# Patient Record
Sex: Female | Born: 1985
Health system: Southern US, Community
[De-identification: ages and names within clinical notes are randomized; demographics above are authoritative.]

## PROBLEM LIST (undated history)

## (undated) DIAGNOSIS — K219 Gastro-esophageal reflux disease without esophagitis: Secondary | ICD-10-CM

## (undated) DIAGNOSIS — B009 Herpesviral infection, unspecified: Secondary | ICD-10-CM

## (undated) DIAGNOSIS — J302 Other seasonal allergic rhinitis: Secondary | ICD-10-CM

## (undated) DIAGNOSIS — G43909 Migraine, unspecified, not intractable, without status migrainosus: Secondary | ICD-10-CM

## (undated) DIAGNOSIS — R51 Headache: Secondary | ICD-10-CM

## (undated) DIAGNOSIS — E282 Polycystic ovarian syndrome: Secondary | ICD-10-CM

## (undated) DIAGNOSIS — F32A Depression, unspecified: Secondary | ICD-10-CM

## (undated) DIAGNOSIS — R519 Headache, unspecified: Secondary | ICD-10-CM

## (undated) HISTORY — PX: CHOLECYSTECTOMY: SHX55

## (undated) HISTORY — DX: Gastro-esophageal reflux disease without esophagitis: K21.9

## (undated) HISTORY — DX: Depression, unspecified: F32.A

## (undated) HISTORY — DX: Polycystic ovarian syndrome: E28.2

## (undated) HISTORY — PX: TUBAL LIGATION: SHX77

## (undated) HISTORY — DX: Headache: R51

## (undated) HISTORY — DX: Migraine, unspecified, not intractable, without status migrainosus: G43.909

## (undated) HISTORY — DX: Herpesviral infection, unspecified: B00.9

## (undated) HISTORY — DX: Headache, unspecified: R51.9

## (undated) HISTORY — DX: Other seasonal allergic rhinitis: J30.2

---
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
## 2006-01-01 ENCOUNTER — Emergency Department: Payer: Self-pay | Admitting: Emergency Medicine

## 2006-06-19 ENCOUNTER — Emergency Department: Payer: Self-pay | Admitting: Emergency Medicine

## 2006-06-29 ENCOUNTER — Emergency Department: Payer: Self-pay | Admitting: Unknown Physician Specialty

## 2006-08-18 ENCOUNTER — Encounter: Payer: Self-pay | Admitting: Maternal & Fetal Medicine

## 2006-12-18 ENCOUNTER — Observation Stay: Payer: Self-pay | Admitting: Obstetrics and Gynecology

## 2006-12-20 ENCOUNTER — Observation Stay: Payer: Self-pay | Admitting: Obstetrics and Gynecology

## 2007-01-20 ENCOUNTER — Inpatient Hospital Stay: Payer: Self-pay | Admitting: Obstetrics and Gynecology

## 2007-10-28 ENCOUNTER — Emergency Department: Payer: Self-pay | Admitting: Emergency Medicine

## 2008-03-14 ENCOUNTER — Emergency Department: Payer: Self-pay | Admitting: Emergency Medicine

## 2008-03-29 ENCOUNTER — Emergency Department: Payer: Self-pay | Admitting: Emergency Medicine

## 2008-05-16 ENCOUNTER — Emergency Department: Payer: Self-pay | Admitting: Internal Medicine

## 2008-09-12 ENCOUNTER — Emergency Department: Payer: Self-pay | Admitting: Unknown Physician Specialty

## 2009-10-18 ENCOUNTER — Ambulatory Visit: Payer: Self-pay | Admitting: General Practice

## 2010-04-08 ENCOUNTER — Emergency Department: Payer: Self-pay | Admitting: Emergency Medicine

## 2013-01-01 ENCOUNTER — Emergency Department: Payer: Self-pay | Admitting: Emergency Medicine

## 2013-01-07 ENCOUNTER — Emergency Department: Payer: Self-pay | Admitting: Emergency Medicine

## 2013-05-18 ENCOUNTER — Emergency Department: Payer: Self-pay | Admitting: Emergency Medicine

## 2013-05-18 LAB — COMPREHENSIVE METABOLIC PANEL
ALT: 58 U/L (ref 12–78)
AST: 26 U/L (ref 15–37)
Albumin: 4.2 g/dL (ref 3.4–5.0)
Alkaline Phosphatase: 67 U/L
Anion Gap: 7 (ref 7–16)
BILIRUBIN TOTAL: 0.5 mg/dL (ref 0.2–1.0)
BUN: 10 mg/dL (ref 7–18)
Calcium, Total: 9.7 mg/dL (ref 8.5–10.1)
Chloride: 103 mmol/L (ref 98–107)
Co2: 28 mmol/L (ref 21–32)
Creatinine: 0.72 mg/dL (ref 0.60–1.30)
EGFR (Non-African Amer.): >60
GLUCOSE: 118 mg/dL — AB (ref 65–99)
OSMOLALITY: 276 (ref 275–301)
POTASSIUM: 3.2 mmol/L — AB (ref 3.5–5.1)
Sodium: 138 mmol/L (ref 136–145)
TOTAL PROTEIN: 8 g/dL (ref 6.4–8.2)

## 2013-05-18 LAB — CBC WITH DIFFERENTIAL/PLATELET
BASOS ABS: 0 10*3/uL (ref 0.0–0.1)
Basophil %: 0.2 %
Eosinophil #: 0 10*3/uL (ref 0.0–0.7)
Eosinophil %: 0 %
HCT: 44.9 % (ref 35.0–47.0)
HGB: 15.8 g/dL (ref 12.0–16.0)
LYMPHS PCT: 26 %
Lymphocyte #: 2.4 10*3/uL (ref 1.0–3.6)
MCH: 30.5 pg (ref 26.0–34.0)
MCHC: 35.3 g/dL (ref 32.0–36.0)
MCV: 87 fL (ref 80–100)
MONOS PCT: 10.1 %
Monocyte #: 0.9 x10 3/mm (ref 0.2–0.9)
Neutrophil #: 5.9 10*3/uL (ref 1.4–6.5)
Neutrophil %: 63.7 %
Platelet: 225 10*3/uL (ref 150–440)
RBC: 5.19 10*6/uL (ref 3.80–5.20)
RDW: 12.7 % (ref 11.5–14.5)
WBC: 9.3 10*3/uL (ref 3.6–11.0)

## 2013-05-18 LAB — URINALYSIS, COMPLETE
BLOOD: NEGATIVE
Bacteria: NONE SEEN
Bilirubin,UR: NEGATIVE
Glucose,UR: NEGATIVE mg/dL (ref 0–75)
Ketone: NEGATIVE
LEUKOCYTE ESTERASE: NEGATIVE
Nitrite: NEGATIVE
PH: 6 (ref 4.5–8.0)
PROTEIN: NEGATIVE
RBC,UR: 3 /HPF (ref 0–5)
SPECIFIC GRAVITY: 1.025 (ref 1.003–1.030)
Squamous Epithelial: 3

## 2013-05-18 LAB — LIPASE, BLOOD: Lipase: 178 U/L (ref 73–393)

## 2013-06-07 ENCOUNTER — Ambulatory Visit: Payer: Self-pay | Admitting: Anesthesiology

## 2013-06-07 LAB — HEPATIC FUNCTION PANEL A (ARMC)
AST: 16 U/L (ref 15–37)
Albumin: 3.6 g/dL (ref 3.4–5.0)
Alkaline Phosphatase: 63 U/L
BILIRUBIN TOTAL: 0.6 mg/dL (ref 0.2–1.0)
SGPT (ALT): 39 U/L (ref 12–78)
Total Protein: 7.3 g/dL (ref 6.4–8.2)

## 2013-06-07 LAB — POTASSIUM: POTASSIUM: 3.4 mmol/L — AB (ref 3.5–5.1)

## 2013-06-09 ENCOUNTER — Ambulatory Visit: Payer: Self-pay | Admitting: Surgery

## 2013-06-10 LAB — PATHOLOGY REPORT

## 2014-03-05 ENCOUNTER — Emergency Department: Payer: Self-pay | Admitting: Student

## 2014-03-05 LAB — CBC WITH DIFFERENTIAL/PLATELET
Basophil #: 0 10*3/uL (ref 0.0–0.1)
Basophil %: 0.3 %
Eosinophil #: 0 10*3/uL (ref 0.0–0.7)
Eosinophil %: 0 %
HCT: 47.3 % — ABNORMAL HIGH (ref 35.0–47.0)
HGB: 16 g/dL (ref 12.0–16.0)
LYMPHS PCT: 20.1 %
Lymphocyte #: 2.6 10*3/uL (ref 1.0–3.6)
MCH: 29.8 pg (ref 26.0–34.0)
MCHC: 33.8 g/dL (ref 32.0–36.0)
MCV: 88 fL (ref 80–100)
MONOS PCT: 6.2 %
Monocyte #: 0.8 x10 3/mm (ref 0.2–0.9)
NEUTROS PCT: 73.4 %
Neutrophil #: 9.5 10*3/uL — ABNORMAL HIGH (ref 1.4–6.5)
Platelet: 280 10*3/uL (ref 150–440)
RBC: 5.35 10*6/uL — AB (ref 3.80–5.20)
RDW: 12.7 % (ref 11.5–14.5)
WBC: 12.9 10*3/uL — AB (ref 3.6–11.0)

## 2014-03-05 LAB — BASIC METABOLIC PANEL
Anion Gap: 9 (ref 7–16)
BUN: 10 mg/dL (ref 7–18)
CHLORIDE: 108 mmol/L — AB (ref 98–107)
CO2: 20 mmol/L — AB (ref 21–32)
Calcium, Total: 9.2 mg/dL (ref 8.5–10.1)
Creatinine: 0.67 mg/dL (ref 0.60–1.30)
EGFR (Non-African Amer.): >60
Glucose: 117 mg/dL — ABNORMAL HIGH (ref 65–99)
Osmolality: 274 (ref 275–301)
POTASSIUM: 3.4 mmol/L — AB (ref 3.5–5.1)
Sodium: 137 mmol/L (ref 136–145)

## 2014-03-05 LAB — URINALYSIS, COMPLETE
Bacteria: NONE SEEN
Bilirubin,UR: NEGATIVE
Blood: NEGATIVE
GLUCOSE, UR: NEGATIVE mg/dL (ref 0–75)
Ketone: NEGATIVE
Leukocyte Esterase: NEGATIVE
Nitrite: NEGATIVE
PROTEIN: NEGATIVE
Ph: 5 (ref 4.5–8.0)
RBC,UR: 3 /HPF (ref 0–5)
Specific Gravity: 1.029 (ref 1.003–1.030)
Squamous Epithelial: 5
WBC UR: 2 /HPF (ref 0–5)

## 2014-07-30 NOTE — Op Note (Signed)
PATIENT NAME:  April HalimLAWLESS, [name] MR#:  [id number] DATE OF BIRTH:  06-Sep-1985  DATE OF PROCEDURE:  06/09/2013  PREOPERATIVE DIAGNOSIS: Symptomatic cholelithiasis.   POSTOPERATIVE DIAGNOSIS: Symptomatic cholelithiasis.   PROCEDURE PERFORMED: Laparoscopic cholecystectomy.   SURGEON: Dionne Miloichard Cooper, M.D.   ANESTHESIA: General with endotracheal.   ASSISTANT:  Alfredo MartinezJustin Ollis, PA-S.   INDICATIONS: This is a patient with recurrent epigastric and right upper quadrant pain associated with fatty food intolerance and workup showing gallstones. Preoperatively, we discussed the rationale for surgery, the options of observation, risk of bleeding, infection, recurrence of symptoms, failure to resolve her symptoms, open procedure, bile duct damage, bile duct leak, retained common bile duct stone, any of which could require further surgery and/or ERCP, stent and papillotomy. This was all reviewed for her in the preop holding area. She understood and agreed to proceed.   FINDINGS: Fairly normal-appearing gallbladder with some scar in the infundibulum of the gallbladder with stones,   DESCRIPTION OF PROCEDURE: The patient was induced with general anesthesia, given IV antibiotics. VTE prophylaxis was in place. She was prepped and draped in a sterile fashion. Marcaine was infiltrated in the skin and subcutaneous tissues around the supraumbilical area, avoiding her piercing site. A Veress needle was placed. Pneumoperitoneum was obtained and a 5 mm trocar port was placed. The abdominal cavity was explored and under direct vision a 10 mm epigastric port and 2 lateral 5 mm ports were placed. The gallbladder was placed on tension. Adhesions were taken down bluntly and sharply without the use of energy. The peritoneum over the infundibulum was incised bluntly. The cystic duct and gallbladder junction was well identified. The cystic lymphatics were doubly clipped and divided, as was the cystic artery. This allowed for good  visualization of the cystic duct as it entered the infundibulum of the gallbladder. Here, it was doubly clipped and divided and the gallbladder was taken from the gallbladder fossa with electrocautery and passed out through the epigastric port site with the aid of an EndoCatch bag. The area was checked for hemostasis and found to be adequate. There was no of bleeding, bile leak or bowel injury. The camera was placed in the epigastric site to view back at the periumbilical site. No sign of adhesions or hernia. Therefore, after irrigating and aspirating and rechecking hemostasis and finding it to be adequate, the camera was removed. Pneumoperitoneum was released. All ports were removed and then figure-of-eight 0 Vicryl sutures were placed at the epigastric fascia, then 4-0 subcuticular Monocryl was used on all skin edges. Steri-Strips, Mastisol and sterile dressings were placed.   The patient tolerated the procedure well. There were no complications. She was taken to the recovery room in stable condition to be discharged in the care of her family to follow up in 10 days.     ____________________________ Adah Salvageichard E. Excell Seltzerooper, MD rec:dmm D: 06/09/2013 13:10:00 ET T: 06/09/2013 19:23:20 ET JOB#: [id number]  cc: Adah Salvageichard E. Excell Seltzerooper, MD, <Dictator> Lattie HawICHARD E COOPER MD ELECTRONICALLY SIGNED 06/11/2013 19:11

## 2014-07-30 NOTE — H&P (Signed)
PATIENT NAME:  April Buck, April R MR#:  [id number] DATE OF BIRTH:  September 07, 1985  DATE OF ADMISSION:  06/09/2013  CHIEF COMPLAINT: Right upper quadrant pain.   HISTORY OF PRESENT ILLNESS: This is a patient with recurrent epigastric and right upper quadrant pain associated with fatty food intolerance. A workup has shown gallstones, and she was seen in the office, where the decision to perform laparoscopic cholecystectomy for control of her symptoms was made. She has been counseled and is here for elective surgery.   PAST MEDICAL HISTORY: C-section x2, tubal ligation and LEEP surgery.   ALLERGIES: LATEX.   MEDICATIONS: None.   FAMILY HISTORY: Noncontributory.   SOCIAL HISTORY: The patient does not smoke and rarely drinks alcohol.   REVIEW OF SYSTEMS: A 10-system review has been performed and is documented in the office chart and negative other than that mentioned in the HPI.   PHYSICAL EXAMINATION:  GENERAL: Healthy female patient.  HEENT: Shows no scleral icterus.  NECK: No palpable neck nodes.  CHEST: Clear to auscultation.  CARDIAC: Regular rate and rhythm.  ABDOMEN: Soft, nontender.  EXTREMITIES: Without edema.  NEUROLOGIC: Grossly intact.  INTEGUMENT: Shows no jaundice.   LABORATORY VALUES: Demonstrated an H and H of 15.8 and 44.9 and a platelet count of 225. AST and ALT are within normal limits. An ultrasound shows gallstones.   ASSESSMENT AND PLAN: This is a patient with symptomatic cholelithiasis. She is here for elective laparoscopic cholecystectomy. The rationale for offering surgery has been discussed. The options of observation and the risks of bleeding, infection, recurrence of symptoms, conversion to an open procedure, bile duct damage, bile duct leak, retained common bile duct stone, bowel injury, any of which could require further surgery and/or ERCP, stent and papillotomy, have all been discussed with her. She understood and agreed to proceed.    ____________________________ Adah Salvageichard E. Excell Seltzerooper, MD rec:lb D: 06/09/2013 08:32:46 ET T: 06/09/2013 08:57:22 ET JOB#: [id number]  cc: Adah Salvageichard E. Excell Seltzerooper, MD, <Dictator> Lattie HawICHARD E Tranice Laduke MD ELECTRONICALLY SIGNED 06/11/2013 19:11

## 2014-12-29 ENCOUNTER — Emergency Department: Payer: Self-pay

## 2014-12-29 ENCOUNTER — Emergency Department
Admission: EM | Admit: 2014-12-29 | Discharge: 2014-12-29 | Disposition: A | Discharge status: Home / Self Care | Payer: Self-pay | Attending: Emergency Medicine | Admitting: Emergency Medicine

## 2014-12-29 ENCOUNTER — Encounter: Payer: Self-pay | Admitting: Emergency Medicine

## 2014-12-29 DIAGNOSIS — Y998 Other external cause status: Secondary | ICD-10-CM | POA: Insufficient documentation

## 2014-12-29 DIAGNOSIS — Y9289 Other specified places as the place of occurrence of the external cause: Secondary | ICD-10-CM | POA: Insufficient documentation

## 2014-12-29 DIAGNOSIS — Y9389 Activity, other specified: Secondary | ICD-10-CM | POA: Insufficient documentation

## 2014-12-29 DIAGNOSIS — X58XXXA Exposure to other specified factors, initial encounter: Secondary | ICD-10-CM | POA: Insufficient documentation

## 2014-12-29 DIAGNOSIS — S93401A Sprain of unspecified ligament of right ankle, initial encounter: Secondary | ICD-10-CM | POA: Insufficient documentation

## 2014-12-29 MED ORDER — NAPROXEN 500 MG PO TBEC: 500.0000 mg | DELAYED_RELEASE_TABLET | Freq: Two times a day (BID) | ORAL | Status: DC

## 2014-12-29 NOTE — ED Provider Notes (Signed)
[facility] Emergency Department Provider Note ____________________________________________  Time seen: 1250  I have reviewed the triage vital signs and the nursing notes.  HISTORY  Chief Complaint  Ankle Pain  HPI April Buck is a 29 y.o. female reports to the ED for evaluation of continued intermittent swelling to the right ankle after original injury 2 months prior. He reports she was playing with the children at the playground, and ran up the slide when she accidentally injured her ankle. She noted subsequent swelling, bruising, pain, and disability following the injury. She dosed ibuprofen, and intermittently apply ice. She did note that there were times when she had very little pain to the ankle and at the time she had recurrent swelling to the ankle. She denies any injury or reinjury in the interim she presents today for evaluation of lateral right ankle pain and swelling.  History reviewed. No pertinent past medical history.  There are no active problems to display for this patient.  History reviewed. No pertinent past surgical history.  Current Outpatient Rx  Name  Route  Sig  Dispense  Refill  . naproxen (EC NAPROSYN) 500 MG EC tablet   Oral   Take 1 tablet (500 mg total) by mouth 2 (two) times daily with a meal.   30 tablet   0     Allergies Review of patient's allergies indicates no known allergies.  No family history on file.  Social History Social History  Substance Use Topics  . Smoking status: Never Smoker   . Smokeless tobacco: None  . Alcohol Use: No   Review of Systems  Constitutional: Negative for fever. Eyes: Negative for visual changes. ENT: Negative for sore throat. Cardiovascular: Negative for chest pain. Respiratory: Negative for shortness of breath. Gastrointestinal: Negative for abdominal pain, vomiting and diarrhea. Genitourinary: Negative for dysuria. Musculoskeletal: Negative for back pain. Reports  intermittent right ankle pain and swelling as above Skin: Negative for rash. Neurological: Negative for headaches, focal weakness or numbness. ____________________________________________  PHYSICAL EXAM:  VITAL SIGNS: ED Triage Vitals  Enc Vitals Group     BP 12/29/14 1132 143/70 mmHg     Pulse Rate 12/29/14 1132 90     Resp 12/29/14 1132 20     Temp 12/29/14 1132 98 F (36.7 C)     Temp Source 12/29/14 1132 Oral     SpO2 12/29/14 1132 100 %     Weight 12/29/14 1132 218 lb (98.884 kg)     Height 12/29/14 1132  (1.676 m)     Head Cir --      Peak Flow --      Pain Score --      Pain Loc --      Pain Edu? --      Excl. in GC? --    Constitutional: Alert and oriented. Well appearing and in no distress. Eyes: Conjunctivae are normal. PERRL. Normal extraocular movements. ENT   Head: Normocephalic and atraumatic.   Nose: No congestion/rhinorrhea.   Mouth/Throat: Mucous membranes are moist.   Neck: Supple. No thyromegaly. Hematological/Lymphatic/Immunological: No cervical lymphadenopathy. Cardiovascular: Normal rate, regular rhythm. Normal distal pulses. Respiratory: Normal respiratory effort. No wheezes/rales/rhonchi. Gastrointestinal: Soft and nontender. No distention. Musculoskeletal: Right ankle without obvious deformity, swelling, or ecchymosis. She is minimally tender to palp over the lateral ankle at the ATF ligament. She noted to have a negative drawer, and or Achilles tenderness. Nontender with normal range of motion in all extremities.  Neurologic:  Normal gait  without ataxia. Normal speech and language. No gross focal neurologic deficits are appreciated. Skin:  Skin is warm, dry and intact. No rash noted. Psychiatric: Mood and affect are normal. Patient exhibits appropriate insight and judgment. ____________________________________________   RADIOLOGY Right Ankle IMPRESSION: No fracture or dislocation.  I, Menshew, Charlesetta Ivory, personally viewed  and evaluated these images (plain radiographs) as part of my medical decision making.  ____________________________________________  PROCEDURES  AnkleStirrup Ace bandage ____________________________________________  INITIAL IMPRESSION / ASSESSMENT AND PLAN / ED COURSE  Grade 2 ankle sprain with recurrent swelling. Patient will be fitted with a stirrup splint, and given instructions on ankle rehabilitation exercises. She will be encouraged follow-up with orthopedics for ongoing symptoms. ____________________________________________  FINAL CLINICAL IMPRESSION(S) / ED DIAGNOSES  Final diagnoses:  Ankle sprain, right, initial encounter      Lissa Hoard, PA-C 12/29/14 1343  Minna Antis, MD 12/29/14 1506

## 2014-12-29 NOTE — Discharge Instructions (Signed)
Ankle Exercises for Rehabilitation Following ankle injuries, it is as important to follow your caregiver's instructions for regaining full use of your ankle as it was to follow the initial treatment plan following the injury. The following are some suggestions for exercises and treatment, which can be done to help you regain full use of your ankle as soon as possible.  Follow all instructions regarding physical therapy.  Before exercising, it may be helpful to use heat on the muscles or joint being exercised. This loosens up the muscles and tendons (cordlike structure) and decreases chances of injury during your exercises. If this is not possible, just begin your exercises slowly to gradually warm up.  Stand on your toes several times per day to strengthen the calf muscles. These are the muscles in the back of your leg between the knee and the heel. The cord you can feel just above the heel is the Achilles tendon. Rise up on your toes several times repeating this three to four times per day. Do not exercise to the point of pain. If pain starts to develop, decrease the exercise until you are comfortable again.  Do range of motion exercises. This means moving the ankle in all directions. Practice writing the alphabet with your toes in the air. Do not increase beyond a range that is comfortable.  Increase the strength of the muscles in the front of your leg by raising your toes and foot straight up in the air. Repeat this exercise as you did the calf exercise with the same warnings. This also help to stretch your muscles.  Stretch your calf muscles also by leaning against a wall with your hands in front of you. Put your feet a few feet from the wall and bend your knees until you feel the muscles in your calves become tight.  After exercising it may be helpful to put ice on the ankle to prevent swelling and improve rehabilitation. This may be done for 15 to 20 minutes following your exercises. If  exercising is being done in the workplace, this may not always be possible.  Taping an ankle injury may be helpful to give added support following an injury. It also may help prevent reinjury. This may be true if you are in training or in a conditioning program. You and your caregiver can decide on the best course of action to follow. Document Released: 03/22/2000 Document Revised: 08/09/2013 Document Reviewed: 03/19/2008 [facility] Patient Information 2015 [place], [place]. This information is not intended to replace advice given to you by your health care provider. Make sure you discuss any questions you have with your health care provider.  Ankle Sprain An ankle sprain is an injury to the strong, fibrous tissues (ligaments) that hold your ankle bones together.  HOME CARE   Put ice on your ankle for 1-2 days or as told by your doctor.  Put ice in a plastic bag.  Place a towel between your skin and the bag.  Leave the ice on for 15-20 minutes at a time, every 2 hours while you are awake.  Only take medicine as told by your doctor.  Raise (elevate) your injured ankle above the level of your heart as much as possible for 2-3 days.  Use crutches if your doctor tells you to. Slowly put your own weight on the affected ankle. Use the crutches until you can walk without pain.  If you have a plaster splint:  Do not rest it on anything harder than a pillow  for 24 hours.  Do not put weight on it.  Do not get it wet.  Take it off to shower or bathe.  If given, use an elastic wrap or support stocking for support. Take the wrap off if your toes lose feeling (numb), tingle, or turn cold or blue.  If you have an air splint:  Add or let out air to make it comfortable.  Take it off at night and to shower and bathe.  Wiggle your toes and move your ankle up and down often while you are wearing it. GET HELP IF:  You have rapidly increasing bruising or puffiness (swelling).  Your toes feel  very cold.  You lose feeling in your foot.  Your medicine does not help your pain. GET HELP RIGHT AWAY IF:   Your toes lose feeling (numb) or turn blue.  You have severe pain that is increasing. MAKE SURE YOU:   Understand these instructions.  Will watch your condition.  Will get help right away if you are not doing well or get worse. Document Released: 09/11/2007 Document Revised: 08/09/2013 Document Reviewed: 10/07/2011 [facility] Patient Information 2015 [place], [place]. This information is not intended to replace advice given to you by your health care provider. Make sure you discuss any questions you have with your health care provider.  Keep the ankle splinted as needed for support. Take the prescription Naproxen as needed for pain and inflammation. Apply ice as needed. Follow-up with Dr. Martha Clan as needed.

## 2014-12-29 NOTE — ED Notes (Signed)
States she had an injury to right ankle 2 months ago conts to have swelling and pain

## 2015-06-08 ENCOUNTER — Encounter (INDEPENDENT_AMBULATORY_CARE_PROVIDER_SITE_OTHER): Payer: Self-pay

## 2015-06-08 ENCOUNTER — Encounter: Payer: Self-pay | Admitting: Primary Care

## 2015-06-08 ENCOUNTER — Ambulatory Visit (INDEPENDENT_AMBULATORY_CARE_PROVIDER_SITE_OTHER): Payer: BLUE CROSS/BLUE SHIELD | Admitting: Primary Care

## 2015-06-08 ENCOUNTER — Other Ambulatory Visit: Payer: Self-pay | Admitting: Primary Care

## 2015-06-08 VITALS — BP 118/72 | HR 88 | Temp 98.1°F | Ht 65.0 in | Wt 218.0 lb

## 2015-06-08 DIAGNOSIS — R002 Palpitations: Secondary | ICD-10-CM

## 2015-06-08 DIAGNOSIS — A6000 Herpesviral infection of urogenital system, unspecified: Secondary | ICD-10-CM

## 2015-06-08 DIAGNOSIS — F411 Generalized anxiety disorder: Secondary | ICD-10-CM | POA: Insufficient documentation

## 2015-06-08 DIAGNOSIS — N926 Irregular menstruation, unspecified: Secondary | ICD-10-CM

## 2015-06-08 LAB — CBC
HEMATOCRIT: 43.5 % (ref 36.0–46.0)
Hemoglobin: 14.8 g/dL (ref 12.0–15.0)
MCHC: 34.1 g/dL (ref 30.0–36.0)
MCV: 86.8 fl (ref 78.0–100.0)
PLATELETS: 267 10*3/uL (ref 150.0–400.0)
RBC: 5.01 Mil/uL (ref 3.87–5.11)
RDW: 12.7 % (ref 11.5–15.5)
WBC: 7.5 10*3/uL (ref 4.0–10.5)

## 2015-06-08 LAB — COMPREHENSIVE METABOLIC PANEL
ALBUMIN: 4.4 g/dL (ref 3.5–5.2)
ALK PHOS: 45 U/L (ref 39–117)
ALT: 38 U/L — ABNORMAL HIGH (ref 0–35)
AST: 23 U/L (ref 0–37)
BUN: 10 mg/dL (ref 6–23)
CALCIUM: 9.4 mg/dL (ref 8.4–10.5)
CHLORIDE: 105 meq/L (ref 96–112)
CO2: 27 mEq/L (ref 19–32)
CREATININE: 0.66 mg/dL (ref 0.40–1.20)
GFR: 112.44 mL/min (ref >60.00)
Glucose, Bld: 88 mg/dL (ref 70–99)
POTASSIUM: 3.5 meq/L (ref 3.5–5.1)
Sodium: 139 mEq/L (ref 135–145)
TOTAL PROTEIN: 7.3 g/dL (ref 6.0–8.3)
Total Bilirubin: 0.6 mg/dL (ref 0.2–1.2)

## 2015-06-08 LAB — TSH: TSH: 0.75 u[IU]/mL (ref 0.35–4.50)

## 2015-06-08 LAB — TESTOSTERONE: Testosterone: 50.48 ng/dL — ABNORMAL HIGH (ref 15.00–40.00)

## 2015-06-08 LAB — HEMOGLOBIN A1C: HEMOGLOBIN A1C: 5.3 % (ref 4.6–6.5)

## 2015-06-08 LAB — LIPID PANEL
CHOLESTEROL: 147 mg/dL (ref 0–200)
HDL: 45.1 mg/dL (ref >39.00)
LDL Cholesterol: 71 mg/dL (ref 0–99)
NONHDL: 101.62
TRIGLYCERIDES: 151 mg/dL — AB (ref 0.0–149.0)
Total CHOL/HDL Ratio: 3
VLDL: 30.2 mg/dL (ref 0.0–40.0)

## 2015-06-08 MED ORDER — VALACYCLOVIR HCL 500 MG PO TABS: 500.0000 mg | ORAL_TABLET | Freq: Two times a day (BID) | ORAL | Status: DC

## 2015-06-08 MED ORDER — ESCITALOPRAM OXALATE 10 MG PO TABS: 10.0000 mg | ORAL_TABLET | Freq: Every day | ORAL | Status: DC

## 2015-06-08 NOTE — Progress Notes (Signed)
Subjective:    Patient ID: April Buck, female    DOB: 1986/03/03, 30 y.o.   MRN: [id number]  HPI  April Buck is a 30 year old female who presents today to establish care and discuss the problems mentioned below. Will obtain old records. Her last physical was years ago.   1) Anxiety: No formal diagnosis. She describes symptoms of feeling easily irritable, anxious, restless, intermittent palpitations, and daily worry. Her symptoms have been present for the past 7 years. She has tried managing her symptoms with meditations, meeting with a therapist provided at work, and deep breathing exercises. She's never been managed on medication in the past. GAD 7 score of 19 today. Denies depression symptoms. Overall her calming mechanisms aren't working to calm her anxiety.  2) Palpitations: Present since last pregnancy about 4 years ago. Will occur intermittently at rest and during exertion. Periods of "heart racing" that will last seconds to minutes. She does have a GAD 7 score of 19 today. FH of heart attack from aunt and grandmother. Denies chest pain. No recent blood work and has not sought care for palpitations in the past.  3) Irregular Periods: Present for 3 years. She's had a tubal ligation. Her LMP was in mid Februray 2017, prior to that was December 2016, then October 2016. Endorses excessive hair growth to breasts, abdomen, face, neck. FH of diabetes in mother and grandparents. She's checking her sugar at home sporatically and will get readings on average of 140. Denies polyuria, polydipsia, polyphagia.  Review of Systems  Constitutional: Negative for unexpected weight change.  HENT: Negative for rhinorrhea.   Respiratory: Negative for shortness of breath.   Cardiovascular: Positive for palpitations.  Gastrointestinal: Negative for abdominal pain.  Genitourinary: Positive for menstrual problem.  Skin: Negative for rash.  Allergic/Immunologic: Negative for environmental allergies.    Neurological: Negative for headaches.  Psychiatric/Behavioral: The patient is nervous/anxious.        Past Medical History  Diagnosis Date  . Frequent headaches   . GERD (gastroesophageal reflux disease)   . Seasonal allergies   . Herpes     Social History   Social History  . Marital Status: Married    Spouse Name: N/A  . Number of Children: N/A  . Years of Education: N/A   Occupational History  . Not on file.   Social History Main Topics  . Smoking status: Never Smoker   . Smokeless tobacco: Not on file  . Alcohol Use: No  . Drug Use: No  . Sexual Activity: Not on file   Other Topics Concern  . Not on file   Social History Narrative   Married.   3 children.    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   Enjoys going to comedy clubs, riding horses, spending time with family.     Past Surgical History  Procedure Laterality Date  . Cholecystectomy    . Cesarean section    . Tubal ligation      Family History  Problem Relation Age of Onset  . Diabetes Mother   . Heart attack Maternal Aunt   . Heart attack Maternal Grandmother   . Diabetes Maternal Grandmother   . Hypertension Mother   . Breast cancer Paternal Grandmother   . Diabetes Maternal Aunt     Allergies  Allergen Reactions  . Latex Hives and Swelling    No current outpatient prescriptions on file prior to visit.   No current facility-administered medications on file  prior to visit.    BP 118/72 mmHg  Pulse 88  Temp(Src) 98.1 F (36.7 C) (Oral)  Ht  (1.651 m)  Wt 218 lb (98.884 kg)  BMI 36.28 kg/m2  SpO2 97%    Objective:   Physical Exam  Constitutional: She is oriented to person, place, and time. She appears well-nourished.  HENT:  Head: Normocephalic.  Neck: Neck supple.  Cardiovascular: Normal rate, regular rhythm and normal heart sounds.   No murmur heard. Pulmonary/Chest: Effort normal and breath sounds normal. She has no wheezes. She has no rales.  Neurological:  She is alert and oriented to person, place, and time.  Skin: Skin is warm and dry.  Psychiatric: She has a normal mood and affect.  Does seem anxious today.          Assessment & Plan:

## 2015-06-08 NOTE — Patient Instructions (Addendum)
Your ECG looks great, which is reassuring.  Complete lab work prior to leaving today. I will notify you of your results once received.   Start Lexapro tablets for anxiety. Take 1/2 tablet daily for 6 days, then advance to 1 full tablet thereafter.   Follow up 6 weeks for re-evaluation.  It was a pleasure to meet you today! Please don't hesitate to call me with any questions. Welcome to [facility]!  Generalized Anxiety Disorder Generalized anxiety disorder (GAD) is a mental disorder. It interferes with life functions, including relationships, work, and school. GAD is different from normal anxiety, which everyone experiences at some point in their lives in response to specific life events and activities. Normal anxiety actually helps [place] prepare for and get through these life events and activities. Normal anxiety goes away after the event or activity is over.  GAD causes anxiety that is not necessarily related to specific events or activities. It also causes excess anxiety in proportion to specific events or activities. The anxiety associated with GAD is also difficult to control. GAD can vary from mild to severe. People with severe GAD can have intense waves of anxiety with physical symptoms (panic attacks).  SYMPTOMS The anxiety and worry associated with GAD are difficult to control. This anxiety and worry are related to many life events and activities and also occur more days than not for 6 months or longer. People with GAD also have three or more of the following symptoms (one or more in children):  Restlessness.   Fatigue.  Difficulty concentrating.   Irritability.  Muscle tension.  Difficulty sleeping or unsatisfying sleep. DIAGNOSIS GAD is diagnosed through an assessment by your health care provider. Your health care provider will ask you questions aboutyour mood,physical symptoms, and events in your life. Your health care provider may ask you about your medical history and use of  alcohol or drugs, including prescription medicines. Your health care provider may also do a physical exam and blood tests. Certain medical conditions and the use of certain substances can cause symptoms similar to those associated with GAD. Your health care provider may refer you to a mental health specialist for further evaluation. TREATMENT The following therapies are usually used to treat GAD:   Medication. Antidepressant medication usually is prescribed for long-term daily control. Antianxiety medicines may be added in severe cases, especially when panic attacks occur.   Talk therapy (psychotherapy). Certain types of talk therapy can be helpful in treating GAD by providing support, education, and guidance. A form of talk therapy called cognitive behavioral therapy can teach you healthy ways to think about and react to daily life events and activities.  Stress managementtechniques. These include yoga, meditation, and exercise and can be very helpful when they are practiced regularly. A mental health specialist can help determine which treatment is best for you. Some people see improvement with one therapy. However, other people require a combination of therapies.   This information is not intended to replace advice given to you by your health care provider. Make sure you discuss any questions you have with your health care provider.   Document Released: 07/20/2012 Document Revised: 04/15/2014 Document Reviewed: 07/20/2012 Elsevier Interactive Patient Education [employer]

## 2015-06-08 NOTE — Progress Notes (Signed)
Pre visit review using our clinic review tool, if applicable. No additional management support is needed unless otherwise documented below in the visit note. 

## 2015-06-08 NOTE — Assessment & Plan Note (Addendum)
Intermittent and infrequent for the past 4 years. Also with undiagnosed GAD with score of 19 today. ECG with NSR rate of 80 without t-wave inversion, PVC's, PAC's. Suspect this is due to anxiety. Will rule out metabolic causes with TSH, CBC, CMP. Patient is stable in the office today.

## 2015-06-08 NOTE — Assessment & Plan Note (Signed)
GAD score of 19 today. Undiagnosed anxiety which will require treatment. Declines therapy today.  Start Lexapro 10 mg tablets. Patient is to take 1/2 tablet daily for 6 days, then advance to 1 full tablet thereafter. We discussed possible side effects of headache, GI upset, drowsiness, and SI/HI. If thoughts of SI/HI develop, we discussed to present to the emergency immediately. Patient verbalized understanding.   Follow up in 6 weeks.

## 2015-06-08 NOTE — Assessment & Plan Note (Signed)
For the past 3 years. Periods about every 2-3 months on average. History of tubal ligation in 2013. Endorses hirsutism and hyperglycemia from home readings. Will do work up for PCOS or metabolic syndrome. A1C and testosterone pending.

## 2015-06-09 ENCOUNTER — Encounter: Payer: Self-pay | Admitting: *Deleted

## 2015-07-03 DIAGNOSIS — Z5321 Procedure and treatment not carried out due to patient leaving prior to being seen by health care provider: Secondary | ICD-10-CM | POA: Diagnosis not present

## 2015-07-03 DIAGNOSIS — Z79899 Other long term (current) drug therapy: Secondary | ICD-10-CM | POA: Insufficient documentation

## 2015-07-03 DIAGNOSIS — R51 Headache: Secondary | ICD-10-CM | POA: Insufficient documentation

## 2015-07-03 DIAGNOSIS — K219 Gastro-esophageal reflux disease without esophagitis: Secondary | ICD-10-CM | POA: Insufficient documentation

## 2015-07-03 DIAGNOSIS — M79602 Pain in left arm: Secondary | ICD-10-CM | POA: Diagnosis present

## 2015-07-03 LAB — TROPONIN I

## 2015-07-03 LAB — CBC
HCT: 41 % (ref 35.0–47.0)
HEMOGLOBIN: 14.4 g/dL (ref 12.0–16.0)
MCH: 29.8 pg (ref 26.0–34.0)
MCHC: 35.1 g/dL (ref 32.0–36.0)
MCV: 85.1 fL (ref 80.0–100.0)
Platelets: 223 10*3/uL (ref 150–440)
RBC: 4.82 MIL/uL (ref 3.80–5.20)
RDW: 12.9 % (ref 11.5–14.5)
WBC: 9.3 10*3/uL (ref 3.6–11.0)

## 2015-07-03 LAB — BASIC METABOLIC PANEL
Anion gap: 5 (ref 5–15)
BUN: 10 mg/dL (ref 6–20)
CALCIUM: 9.4 mg/dL (ref 8.9–10.3)
CHLORIDE: 107 mmol/L (ref 101–111)
CO2: 25 mmol/L (ref 22–32)
CREATININE: 0.64 mg/dL (ref 0.44–1.00)
GFR calc non Af Amer: >60 mL/min (ref >60)
Glucose, Bld: 115 mg/dL — ABNORMAL HIGH (ref 65–99)
Potassium: 3.5 mmol/L (ref 3.5–5.1)
SODIUM: 137 mmol/L (ref 135–145)

## 2015-07-03 NOTE — ED Notes (Addendum)
Patient to ED for left arm pain. States she was sitting and watching television and noted that her left upper arm started to hurt and tingling went all the way to her fingers, and then her fingers started to swell so that she had to take her rings off. States she still has the pain in her arm. Denies chest pain but states she was sweating and difficulty breathing during the event. Currently without difficulty breathing, able to answer questions appropriately without difficulty. Fingers on left hand are swollen.

## 2015-07-04 ENCOUNTER — Ambulatory Visit
Admission: RE | Admit: 2015-07-04 | Discharge: 2015-07-04 | Disposition: A | Discharge status: Home / Self Care | Payer: BLUE CROSS/BLUE SHIELD | Source: Ambulatory Visit | Attending: Emergency Medicine | Admitting: Emergency Medicine

## 2015-07-04 ENCOUNTER — Emergency Department
Admission: EM | Admit: 2015-07-04 | Discharge: 2015-07-04 | Disposition: A | Discharge status: Home / Self Care | Payer: BLUE CROSS/BLUE SHIELD | Attending: Emergency Medicine | Admitting: Emergency Medicine

## 2015-07-04 MED ORDER — NICOTINE 21 MG/24HR TD PT24
MEDICATED_PATCH | TRANSDERMAL | Status: AC
  Filled 2015-07-04: qty 1

## 2015-07-20 ENCOUNTER — Ambulatory Visit: Payer: BLUE CROSS/BLUE SHIELD | Admitting: Primary Care

## 2015-08-01 ENCOUNTER — Ambulatory Visit (INDEPENDENT_AMBULATORY_CARE_PROVIDER_SITE_OTHER): Payer: BLUE CROSS/BLUE SHIELD | Admitting: Primary Care

## 2015-08-01 ENCOUNTER — Encounter: Payer: Self-pay | Admitting: Primary Care

## 2015-08-01 VITALS — BP 116/74 | HR 83 | Temp 98.0°F | Ht 64.0 in | Wt 224.0 lb

## 2015-08-01 DIAGNOSIS — E669 Obesity, unspecified: Secondary | ICD-10-CM | POA: Insufficient documentation

## 2015-08-01 DIAGNOSIS — F411 Generalized anxiety disorder: Secondary | ICD-10-CM | POA: Diagnosis not present

## 2015-08-01 DIAGNOSIS — N926 Irregular menstruation, unspecified: Secondary | ICD-10-CM

## 2015-08-01 MED ORDER — NORGESTIMATE-ETH ESTRADIOL 0.25-35 MG-MCG PO TABS: 1.0000 | ORAL_TABLET | Freq: Every day | ORAL | Status: DC

## 2015-08-01 MED ORDER — VENLAFAXINE HCL ER 37.5 MG PO CP24: 37.5000 mg | ORAL_CAPSULE | Freq: Every day | ORAL | Status: DC

## 2015-08-01 NOTE — Patient Instructions (Signed)
Stop Lexapro 10 mg tablets as they are ineffective.  Start Effexor 37.5 mg (Venlafaxine) tablets. Take 1 tablet by mouth every morning with breakfast.  Start Sprintec birth control tablets this Sunday. Take 1 tablet by mouth everyday at the same time.  It is important that you improve your diet. Please limit carbohydrates in the form of white bread, rice, pasta, desserts, fried food, french fries, sugary drinks, etc. Increase your consumption of fresh fruits and vegetables.  You need to consume about 64 ounces of water daily.  Start exercising. You should be getting 1 hour of moderate intensity exercise 5 days weekly.  Download the APP "My Fitness Pal" to help with calorie counting.  Follow up in 4-6 weeks for re-evaluation of anxiety.  It was a pleasure to see you today!

## 2015-08-01 NOTE — Progress Notes (Signed)
Pre visit review using our clinic review tool, if applicable. No additional management support is needed unless otherwise documented below in the visit note. 

## 2015-08-01 NOTE — Assessment & Plan Note (Signed)
Struggled with for most of life. Poor diet and does not regularly exercise. She is requesting diet pills for which I do not prescribe. Long discussion today spent on changes she can make in her diet and to start exercising. Discussed downloading the APP My Fitness Pal for calorie counting. Handout provided regarding healthy 1200 calorie meal plan.

## 2015-08-01 NOTE — Assessment & Plan Note (Signed)
No improvement with Lexapro for which she took for 6 weeks. Will switch to Effexor XR 37.5 mg once every morning. Discussed ways to manage panic attacks, declines therapy. Denies SI/HI. Follow up in 4-6 weeks.

## 2015-08-01 NOTE — Assessment & Plan Note (Signed)
Periods every 2-3 months, heavy, cramping. A1C and testosterone levels completed last visit and were unremarkable. Will start OCP's today to help with regularity and lighten bleeding. Discussed start date and to take on regular basis.

## 2015-08-01 NOTE — Progress Notes (Signed)
Subjective:    Patient ID: April Buck, female    DOB: 1985-04-16, 30 y.o.   MRN: [id number]  HPI  April Buck is a 30 year old female who presents today for follow up of Generalized Anxiety Disorder and the other problems mentioned below. She was evaluated as a new patient in early March 2017 with GAD 7 score of 19. She was initiated on Lexapro 10 mg last visit, she declined offer for therapy.  Since her last visit she's not noticed improvement. She noticed slight improvement in her agitation in the first 2 weeks, but then began feeling depressed and experiencing agitation, nausea, diarrhea, and drowsiness. She doesn't feel as though the Lexapro helped with her daily irritability. She's had 2 panic attacks within the past 6 weeks. Denies SI/HI.   2) Obesity: Struggled with obesity for most of her life. She has difficulty with binge eating.   She endorses a fair diet which consists of: Breakfast: Skips Lunch: Chicken sandwiches, fries, salad, soups Snacks: Cereal, pop corn Dinner: Subs, fries, fish, salad, hamburgers, pastas, breads, potatoes Desserts: Several times weekly Beverages: Water, juice, coffee  Exercise: She just started exercising by working out on the elliptical for 12 minutes several days weekly. She will also lift weights.   3) Irregular Periods: Present for the past 3 years. She will experience periods every 2-3 months at a time which are heavy and painful. Tubal in 2013. She's not been on birth control recently. She will occasionally experience intense cramping to her right pelvic region. LMP in January 2017.  Review of Systems  Respiratory: Negative for shortness of breath.   Cardiovascular: Negative for chest pain.  Gastrointestinal: Positive for nausea and diarrhea.  Genitourinary:       Irregular, heavy periods  Neurological: Positive for headaches.  Psychiatric/Behavioral: Positive for sleep disturbance. Negative for suicidal ideas. The patient is  nervous/anxious.        Past Medical History  Diagnosis Date  . Frequent headaches   . GERD (gastroesophageal reflux disease)   . Seasonal allergies   . Herpes      Social History   Social History  . Marital Status: Married    Spouse Name: N/A  . Number of Children: N/A  . Years of Education: N/A   Occupational History  . Not on file.   Social History Main Topics  . Smoking status: Never Smoker   . Smokeless tobacco: Not on file  . Alcohol Use: No  . Drug Use: No  . Sexual Activity: Not on file   Other Topics Concern  . Not on file   Social History Narrative   Married.   3 children.    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   Enjoys going to comedy clubs, riding horses, spending time with family.     Past Surgical History  Procedure Laterality Date  . Cholecystectomy    . Cesarean section    . Tubal ligation      Family History  Problem Relation Age of Onset  . Diabetes Mother   . Heart attack Maternal Aunt   . Heart attack Maternal Grandmother   . Diabetes Maternal Grandmother   . Hypertension Mother   . Breast cancer Paternal Grandmother   . Diabetes Maternal Aunt     Allergies  Allergen Reactions  . Latex Hives and Swelling    Current Outpatient Prescriptions on File Prior to Visit  Medication Sig Dispense Refill  . valACYclovir (VALTREX) 500 MG  tablet Take 1 tablet (500 mg total) by mouth 2 (two) times daily. 6 tablet 3   No current facility-administered medications on file prior to visit.    BP 116/74 mmHg  Pulse 83  Temp(Src) 98 F (36.7 C) (Oral)  Ht 5\' 4"  (1.626 m)  Wt 224 lb (101.606 kg)  BMI 38.43 kg/m2  SpO2 97%  LMP 05/10/2015    Objective:   Physical Exam  Constitutional: She appears well-nourished.  Cardiovascular: Normal rate and regular rhythm.   Pulmonary/Chest: Effort normal and breath sounds normal.  Abdominal: Soft.  Skin: Skin is warm.  Psychiatric: She has a normal mood and affect.            Assessment & Plan:

## 2015-08-16 ENCOUNTER — Telehealth: Payer: Self-pay | Admitting: Primary Care

## 2015-08-16 NOTE — Telephone Encounter (Signed)
Pt has appt on 08/17/15 at 9 AM. With Pamala Hurry Baity NP.

## 2015-08-16 NOTE — Telephone Encounter (Signed)
[facility] Client TELEPHONE ADVICE RECORD [facility]  Patient Name: April BreamPRIL Qian  DOB: 1986-03-16    Initial Comment Caller states she is dizzy a lot.   Nurse Assessment  Nurse: Dorthula RuePatten, RN, Enrique SackKendra Date/Time (Eastern Time): 08/16/2015 11:49:54 AM  Confirm and document reason for call. If symptomatic, describe symptoms. You must click the next button to save text entered. ---Caller states she has been having a problem with dizziness since last Friday. She states she also some pus come out of her mouth where wisdom tooth is coming in. Caller has not take temperature.  Has the patient traveled out of the country within the last 30 days? ---No  Does the patient have any new or worsening symptoms? ---Yes  Will a triage be completed? ---Yes  Related visit to physician within the last 2 weeks? ---No  Does the PT have any chronic conditions? (i.e. diabetes, asthma, etc.) ---No  Is the patient pregnant or possibly pregnant? (Ask all females between the ages of 5312-55) ---No  Is this a behavioral health or substance abuse call? ---No     Guidelines    Guideline Title Affirmed Question Affirmed Notes  Dizziness - Vertigo [1] MODERATE dizziness (e.g., vertigo; feels very unsteady, interferes with normal activities) AND [2] has NOT been evaluated by physician for this    Final Disposition User   See Physician within 24 Hours WorthPatten, RN, Enrique SackKendra    Comments  Scheduled with Nicki Reaperegina Baity at 900am, on 05/11, her NP was out of office until next week   Referrals  REFERRED TO PCP OFFICE   Disagree/Comply: Comply

## 2015-08-17 ENCOUNTER — Encounter: Payer: Self-pay | Admitting: Internal Medicine

## 2015-08-17 ENCOUNTER — Ambulatory Visit (INDEPENDENT_AMBULATORY_CARE_PROVIDER_SITE_OTHER): Payer: BLUE CROSS/BLUE SHIELD | Admitting: Internal Medicine

## 2015-08-17 VITALS — BP 112/70 | HR 92 | Temp 98.8°F | Wt 223.5 lb

## 2015-08-17 DIAGNOSIS — N951 Menopausal and female climacteric states: Secondary | ICD-10-CM | POA: Diagnosis not present

## 2015-08-17 DIAGNOSIS — H539 Unspecified visual disturbance: Secondary | ICD-10-CM

## 2015-08-17 DIAGNOSIS — R42 Dizziness and giddiness: Secondary | ICD-10-CM | POA: Diagnosis not present

## 2015-08-17 DIAGNOSIS — R5383 Other fatigue: Secondary | ICD-10-CM | POA: Diagnosis not present

## 2015-08-17 DIAGNOSIS — R232 Flushing: Secondary | ICD-10-CM

## 2015-08-17 LAB — VITAMIN B12: VITAMIN B 12: 203 pg/mL — AB (ref 211–911)

## 2015-08-17 LAB — FOLATE

## 2015-08-17 LAB — VITAMIN D 25 HYDROXY (VIT D DEFICIENCY, FRACTURES): VITD: 25.22 ng/mL — AB (ref 30.00–100.00)

## 2015-08-17 MED ORDER — MECLIZINE HCL 25 MG PO TABS: 25.0000 mg | ORAL_TABLET | Freq: Three times a day (TID) | ORAL | Status: DC | PRN

## 2015-08-17 NOTE — Progress Notes (Signed)
Pre visit review using our clinic review tool, if applicable. No additional management support is needed unless otherwise documented below in the visit note. 

## 2015-08-17 NOTE — Progress Notes (Signed)
Subjective:    Patient ID: April Buck, female    DOB: Oct 25, 1985, 30 y.o.   MRN: [id number]  HPI  Pt presents to the clinic today with c/o dizziness and fatigue. This started 2 weeks ago. She does not feel like the room is spinning, but reports it is more a sense of imbalance, like everything in the room in "shaking". She feels "foggy" in her head but denies blurred or double vision. She gets hot flashes and feels like she is "burining on the inside". She did start Effexor 2 weeks ago but reports these symptoms occurred 2 days prior to taking the medication. She was on Lexpro prior to taking the Effexor, and did not notice any of the symptoms. Her appetite is fine. Her bowels are moving normally. She denies urinary or vaginal complaints. She denies any changes in diet or activity level. She reports she her last eye exam was 1 year ago. Her daughter did break her glasses just prior to the onset of these symptoms, but she did not think this was related. She has had a complete lab workup by PCP including normal A1C and TSH.  She also reports noticing pus coming from her wisdom tooth on the right lower side that is coming in. This happened 3 days ago. She has not noticed any other pus, redness, swelling or pain. She is not sure if this is related, but just wanted to mention it.  Review of Systems      Past Medical History  Diagnosis Date  . Frequent headaches   . GERD (gastroesophageal reflux disease)   . Seasonal allergies   . Herpes     Current Outpatient Prescriptions  Medication Sig Dispense Refill  . norgestimate-ethinyl estradiol (SPRINTEC 28) 0.25-35 MG-MCG tablet Take 1 tablet by mouth daily. 1 Package 11  . valACYclovir (VALTREX) 500 MG tablet Take 1 tablet (500 mg total) by mouth 2 (two) times daily. 6 tablet 3  . venlafaxine XR (EFFEXOR XR) 37.5 MG 24 hr capsule Take 1 capsule (37.5 mg total) by mouth daily with breakfast. 30 capsule 3   No current facility-administered  medications for this visit.    Allergies  Allergen Reactions  . Latex Hives and Swelling    Family History  Problem Relation Age of Onset  . Diabetes Mother   . Heart attack Maternal Aunt   . Heart attack Maternal Grandmother   . Diabetes Maternal Grandmother   . Hypertension Mother   . Breast cancer Paternal Grandmother   . Diabetes Maternal Aunt     Social History   Social History  . Marital Status: Married    Spouse Name: N/A  . Number of Children: N/A  . Years of Education: N/A   Occupational History  . Not on file.   Social History Main Topics  . Smoking status: Never Smoker   . Smokeless tobacco: Not on file  . Alcohol Use: No  . Drug Use: No  . Sexual Activity: Not on file   Other Topics Concern  . Not on file   Social History Narrative   Married.   3 children.    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   Enjoys going to comedy clubs, riding horses, spending time with family.      Constitutional: Pt reports fatigue. Denies fever, malaise, headache or abrupt weight changes.  HEENT: Pt reports tooth pain. Denies eye pain, eye redness, ear pain, ringing in the ears, wax buildup, runny nose, nasal  congestion, bloody nose, or sore throat. Respiratory: Denies difficulty breathing, shortness of breath, cough or sputum production.   Cardiovascular: Denies chest pain, chest tightness, palpitations or swelling in the hands or feet.  Gastrointestinal: Denies abdominal pain, bloating, constipation, diarrhea or blood in the stool.  GU: Denies urgency, frequency, pain with urination, burning sensation, blood in urine, odor or discharge. Neurological: Pt reports dizziness. Denies difficulty with memory, difficulty with speech or problems with balance and coordination.  Psych: Pt has history of anxiety. Denies depression, SI/HI.  No other specific complaints in a complete review of systems (except as listed in HPI above).  Objective:   Physical Exam  BP 112/70  mmHg  Pulse 92  Temp(Src) 98.8 F (37.1 C) (Oral)  Wt 223 lb 8 oz (101.379 kg)  SpO2 98% Wt Readings from Last 3 Encounters:  08/17/15 223 lb 8 oz (101.379 kg)  08/01/15 224 lb (101.606 kg)  07/03/15 218 lb (98.884 kg)    General: Appears her stated age, obese in NAD. HEENT: Throat/Mouth: Wisdom teeth noted bilaterally. No redness, swelling or pus observed in the oral cavity. Neck:  No adenopathy noted. Cardiovascular: Normal rate and rhythm. S1,S2 noted.  No murmur, rubs or gallops noted.  Pulmonary/Chest: Normal effort and positive vesicular breath sounds. No respiratory distress. No wheezes, rales or ronchi noted.  Neurological: Alert and oriented. No nystagmus noted. Coordination normal.  Psychiatric: Mood and affect flat.  BMET    Component Value Date/Time   NA 137 07/03/2015 2156   NA 137 03/05/2014 0721   K 3.5 07/03/2015 2156   K 3.4* 03/05/2014 0721   CL 107 07/03/2015 2156   CL 108* 03/05/2014 0721   CO2 25 07/03/2015 2156   CO2 20* 03/05/2014 0721   GLUCOSE 115* 07/03/2015 2156   GLUCOSE 117* 03/05/2014 0721   BUN 10 07/03/2015 2156   BUN 10 03/05/2014 0721   CREATININE 0.64 07/03/2015 2156   CREATININE 0.67 03/05/2014 0721   CALCIUM 9.4 07/03/2015 2156   CALCIUM 9.2 03/05/2014 0721   GFRNONAA >60 07/03/2015 2156   GFRNONAA >60 03/05/2014 0721   GFRNONAA >60 05/18/2013 0109   GFRAA >60 07/03/2015 2156   GFRAA >60 03/05/2014 0721   GFRAA >60 05/18/2013 0109    Lipid Panel     Component Value Date/Time   CHOL 147 06/08/2015 0911   TRIG 151.0* 06/08/2015 0911   HDL 45.10 06/08/2015 0911   CHOLHDL 3 06/08/2015 0911   VLDL 30.2 06/08/2015 0911   LDLCALC 71 06/08/2015 0911    CBC    Component Value Date/Time   WBC 9.3 07/03/2015 2156   WBC 12.9* 03/05/2014 0721   RBC 4.82 07/03/2015 2156   RBC 5.35* 03/05/2014 0721   HGB 14.4 07/03/2015 2156   HGB 16.0 03/05/2014 0721   HCT 41.0 07/03/2015 2156   HCT 47.3* 03/05/2014 0721   PLT 223 07/03/2015  2156   PLT 280 03/05/2014 0721   MCV 85.1 07/03/2015 2156   MCV 88 03/05/2014 0721   MCH 29.8 07/03/2015 2156   MCH 29.8 03/05/2014 0721   MCHC 35.1 07/03/2015 2156   MCHC 33.8 03/05/2014 0721   RDW 12.9 07/03/2015 2156   RDW 12.7 03/05/2014 0721   LYMPHSABS 2.6 03/05/2014 0721   MONOABS 0.8 03/05/2014 0721   EOSABS 0.0 03/05/2014 0721   BASOSABS 0.0 03/05/2014 0721    Hgb A1C Lab Results  Component Value Date   HGBA1C 5.3 06/08/2015         Assessment &  Plan:   Dizziness, hot flashes, fatigue, difficulty with vision:  Labs reviewed with patient Will check Vit B12, Folate and Vit D today Does not appear to be related to medication, and she reports the Effexor is helping her anxiety, so she would like to continue that Encouraged her to make an appt for an eye exam and new glasses, to see if that is a contributing factor  Will follow up after labs, if symptoms persist, follow up with PCP

## 2015-08-17 NOTE — Patient Instructions (Signed)

## 2015-09-15 ENCOUNTER — Ambulatory Visit: Payer: BLUE CROSS/BLUE SHIELD | Admitting: Primary Care

## 2015-09-27 ENCOUNTER — Telehealth: Payer: Self-pay

## 2015-09-27 NOTE — Telephone Encounter (Signed)
Pt has been taking BC pill as instructed; the past menstrual cycle came on and pt [facility] the [facility]; when finished the white pills; period stopped and pt started new BC pak; after few days pt had another menstrual cycle but with brown discharge rather than bright red and there is an odor for one week; then pt stopped period and after few days brown discharge with odor has started again. [facility] has [facility]. Pt wants to know if should stop BC pill or continue when having the brown discharge like period. Pt has CPX scheduled for 09/29/15. Pt request cb. [facility] [place].

## 2015-09-27 NOTE — Telephone Encounter (Signed)
She needs to continue taking birth control as prescribed as it may take 3-6 months for her period to regulate.

## 2015-09-27 NOTE — Telephone Encounter (Signed)
Spoken and notified patient of Kate's comments. Patient verbalized understanding. 

## 2015-09-29 ENCOUNTER — Other Ambulatory Visit (HOSPITAL_COMMUNITY)
Admission: RE | Admit: 2015-09-29 | Discharge: 2015-09-29 | Disposition: A | Discharge status: Home / Self Care | Payer: BLUE CROSS/BLUE SHIELD | Source: Ambulatory Visit | Attending: Primary Care | Admitting: Primary Care

## 2015-09-29 ENCOUNTER — Encounter: Payer: Self-pay | Admitting: Primary Care

## 2015-09-29 ENCOUNTER — Ambulatory Visit (INDEPENDENT_AMBULATORY_CARE_PROVIDER_SITE_OTHER): Payer: BLUE CROSS/BLUE SHIELD | Admitting: Primary Care

## 2015-09-29 VITALS — BP 114/72 | HR 97 | Temp 98.2°F | Ht 64.0 in | Wt 221.8 lb

## 2015-09-29 DIAGNOSIS — R350 Frequency of micturition: Secondary | ICD-10-CM

## 2015-09-29 DIAGNOSIS — F411 Generalized anxiety disorder: Secondary | ICD-10-CM

## 2015-09-29 DIAGNOSIS — Z0000 Encounter for general adult medical examination without abnormal findings: Secondary | ICD-10-CM | POA: Diagnosis not present

## 2015-09-29 DIAGNOSIS — Z01419 Encounter for gynecological examination (general) (routine) without abnormal findings: Secondary | ICD-10-CM | POA: Diagnosis present

## 2015-09-29 DIAGNOSIS — E669 Obesity, unspecified: Secondary | ICD-10-CM | POA: Diagnosis not present

## 2015-09-29 DIAGNOSIS — N926 Irregular menstruation, unspecified: Secondary | ICD-10-CM

## 2015-09-29 DIAGNOSIS — Z1151 Encounter for screening for human papillomavirus (HPV): Secondary | ICD-10-CM | POA: Insufficient documentation

## 2015-09-29 DIAGNOSIS — Z124 Encounter for screening for malignant neoplasm of cervix: Secondary | ICD-10-CM

## 2015-09-29 LAB — POC URINALSYSI DIPSTICK (AUTOMATED)
Glucose, UA: NEGATIVE
Ketones, UA: NEGATIVE
Leukocytes, UA: NEGATIVE
Nitrite, UA: NEGATIVE
PH UA: 6
RBC UA: NEGATIVE
UROBILINOGEN UA: NEGATIVE

## 2015-09-29 NOTE — Assessment & Plan Note (Signed)
Td UTD. Pap due and completed today. No labs required today. Exam unremarkable except for viral URI. Discussed the importance of a healthy diet and regular exercise in order for weight loss and to reduce risk of other medical diseases.  Follow up in 1 year for repeat physical.

## 2015-09-29 NOTE — Patient Instructions (Addendum)
You have a viral infection that will pass on its own over time.  Try Delsym or Robitussin as needed for cough. This may be purchased over the counter.  Start Zyrtec at bedtime to help dry up the drainage.  Continue nasal spray as needed for nasal congestion.  Please notify me if you develop persistent fevers of 101, start coughing up green mucous consistently, notice increased fatigue or weakness, or feel worse after 1 week of onset of symptoms.   Increase consumption of water intake and rest.  Continue birth control pills as discussed.  Ensure you are consuming 64 ounces of water daily.  Start exercising. You should be getting 1 hour of moderate intensity exercise 5 days weekly.  Reduce consumption of fast food, junk food. Increase vegetables, fruit, lean protein, whole grains.  Your urine test was normal.  Follow up in 1 year for repeat physical or sooner if needed.

## 2015-09-29 NOTE — Progress Notes (Signed)
Pre visit review using our clinic review tool, if applicable. No additional management support is needed unless otherwise documented below in the visit note. 

## 2015-09-29 NOTE — Progress Notes (Signed)
Subjective:    Patient ID: April Buck, female    DOB: [birth year], 30 y.o.   MRN: [id number]  HPI  [name] is a 30 year old female who presents today for complete physical.  Immunizations: -Tetanus: Completed within the last 5 years. -Influenza: Completed in February 2017  Diet: She endorses a poor diet. Breakfast: Skips Lunch: Potatoes, fast food, sandwiches Dinner: Pastas, meat, starches Snacks: None Desserts: None Beverages: Water, soda  Exercise: She does not currently exercise. Eye exam: Completed in [place] 2016 Dental exam: Completed several years ago. Pap Smear: Completed 4 years ago.  1) Urinary Frequency: Also with pelvic pressure and pressure with urinating. Denies hematuria, fevers, abdominal pain. She is in [facility] [employer] [facility] [employer] school and was practicing running urine through a machine. She ran her urine through and saw there were multiple leukocytes. Overall she's feeling improved except for some pressure during urination.   Review of Systems  Constitutional: Positive for fatigue. Negative for fever.  HENT: Positive for congestion, postnasal drip and sore throat.   Respiratory: Positive for cough. Negative for shortness of breath.   Cardiovascular: Negative for chest pain.  Gastrointestinal: Negative for diarrhea and constipation.  Genitourinary: Positive for frequency and menstrual problem. Negative for dysuria and difficulty urinating.  Musculoskeletal: Negative for arthralgias.  Skin: Negative for rash.  Neurological: Positive for headaches. Negative for dizziness.  Psychiatric/Behavioral: Negative for suicidal ideas. The patient is not nervous/anxious.        Stopped taking Effexor several weeks ago and has not experienced dizziness.       Past Medical History  Diagnosis Date  . Frequent headaches   . GERD (gastroesophageal reflux disease)   . Seasonal allergies   . Herpes      Social History   Social History  . Marital Status: Married    Spouse Name:  N/A  . Number of Children: N/A  . Years of Education: N/A   Occupational History  . Not on file.   Social History Main Topics  . Smoking status: Never Smoker   . Smokeless tobacco: Not on file  . Alcohol Use: No  . Drug Use: No  . Sexual Activity: Not on file   Other Topics Concern  . Not on file   Social History Narrative   Married.   3 children.    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   Enjoys going to comedy clubs, riding horses, spending time with family.     Past Surgical History  Procedure Laterality Date  . Cholecystectomy    . Cesarean section    . Tubal ligation      Family History  Problem Relation Age of Onset  . Diabetes Mother   . Heart attack Maternal Aunt   . Heart attack Maternal Grandmother   . Diabetes Maternal Grandmother   . Hypertension Mother   . Breast cancer Paternal Grandmother   . Diabetes Maternal Aunt     Allergies  Allergen Reactions  . Latex Hives and Swelling    Current Outpatient Prescriptions on File Prior to Visit  Medication Sig Dispense Refill  . norgestimate-ethinyl estradiol (SPRINTEC 28) 0.25-35 MG-MCG tablet Take 1 tablet by mouth daily. 1 Package 11  . valACYclovir (VALTREX) 500 MG tablet Take 1 tablet (500 mg total) by mouth 2 (two) times daily. 6 tablet 3  . meclizine (ANTIVERT) 25 MG tablet Take 1 tablet (25 mg total) by mouth 3 (three) times daily as needed for dizziness. (Patient not taking: Reported  on 09/29/2015) 30 tablet 0   No current facility-administered medications on file prior to visit.    BP 114/72 mmHg  Pulse 97  Temp(Src) 98.2 F (36.8 C) (Oral)  Ht 5\' 4"  (1.626 m)  Wt 221 lb 12.8 oz (100.608 kg)  BMI 38.05 kg/m2  SpO2 96%    Objective:   Physical Exam  Constitutional: She is oriented to person, place, and time. She appears well-nourished.  HENT:  Right Ear: Tympanic membrane and ear canal normal.  Left Ear: Tympanic membrane and ear canal normal.  Nose: Nose normal.  Mouth/Throat:  Oropharynx is clear and moist.  Eyes: Conjunctivae and EOM are normal. Pupils are equal, round, and reactive to light.  Neck: Neck supple. No thyromegaly present.  Cardiovascular: Normal rate and regular rhythm.   No murmur heard. Pulmonary/Chest: Effort normal and breath sounds normal. She has no rales.  Abdominal: Soft. Bowel sounds are normal. There is no tenderness.  Genitourinary: Cervix exhibits discharge. Cervix exhibits no motion tenderness. Right adnexum displays no tenderness. Left adnexum displays no tenderness. No vaginal discharge found.  Dark, red blood from cervix, mild.  Musculoskeletal: Normal range of motion.  Lymphadenopathy:    She has no cervical adenopathy.  Neurological: She is alert and oriented to person, place, and time. She has normal reflexes. No cranial nerve deficit.  Skin: Skin is warm and dry. No rash noted.  Psychiatric: She has a normal mood and affect.          Assessment & Plan:  Urinary Frequency:  Also with pelvic pressure and urinary pressure x 1-2 weeks. UA today: Negative. Exam unremarkable. Discussed to increase water and notify me if symptoms persist.

## 2015-09-29 NOTE — Assessment & Plan Note (Signed)
More regular somewhat since initiation of OCP's.

## 2015-09-29 NOTE — Addendum Note (Signed)
Addended by: Tawnya CrookSAMBATH, Rashan Rounsaville on: 09/29/2015 11:15 AM   Modules accepted: Orders

## 2015-09-29 NOTE — Assessment & Plan Note (Signed)
Experienced dizziness on Effexor which dissipated after she stopped taking several weeks ago. Overall feeling well. Denies anxiety and depression. Will continue to monitor off medication.

## 2015-09-29 NOTE — Addendum Note (Signed)
Addended by: Tawnya CrookSAMBATH, Glendell Fouse on: 09/29/2015 01:31 PM   Modules accepted: Orders

## 2015-09-29 NOTE — Assessment & Plan Note (Signed)
Poor diet and does not exercise. Discussed to reduce consumption of junk food and sodas. Start exercising.

## 2015-10-02 ENCOUNTER — Encounter: Payer: Self-pay | Admitting: *Deleted

## 2015-10-02 LAB — CYTOLOGY - PAP

## 2016-02-02 ENCOUNTER — Telehealth: Payer: Self-pay | Admitting: *Deleted

## 2016-02-02 NOTE — Telephone Encounter (Signed)
Pt left voicemail at Triage requesting to schedule a lab appt to have titers done (didn't say which ones) for her clinicals, pt request call back if it's okay to have labs done

## 2016-02-02 NOTE — Telephone Encounter (Signed)
Please call patient and ask for more information. Which titers? We could start by checking the [facility] for immunizations.

## 2016-02-02 NOTE — Telephone Encounter (Signed)
Spoke to pt. I looked at [facility] and found 1 MMR and  Hep B Series. I suggested she check with her insurance to see if they will cover titiers. If not, it may be cheaper to just get the vaccines.

## 2016-02-08 IMAGING — CR DG ANKLE COMPLETE 3+V*R*
1 series · 3 of 3 positions shown · non-contrast
Comparison: None.

CLINICAL DATA: RIGHT ankle injury 2 months prior

EXAM:
RIGHT ANKLE - COMPLETE 3+ VIEW

[Series 1: ap · 0.17mm/px · 3 of 3 slices shown]
[im 1/3]
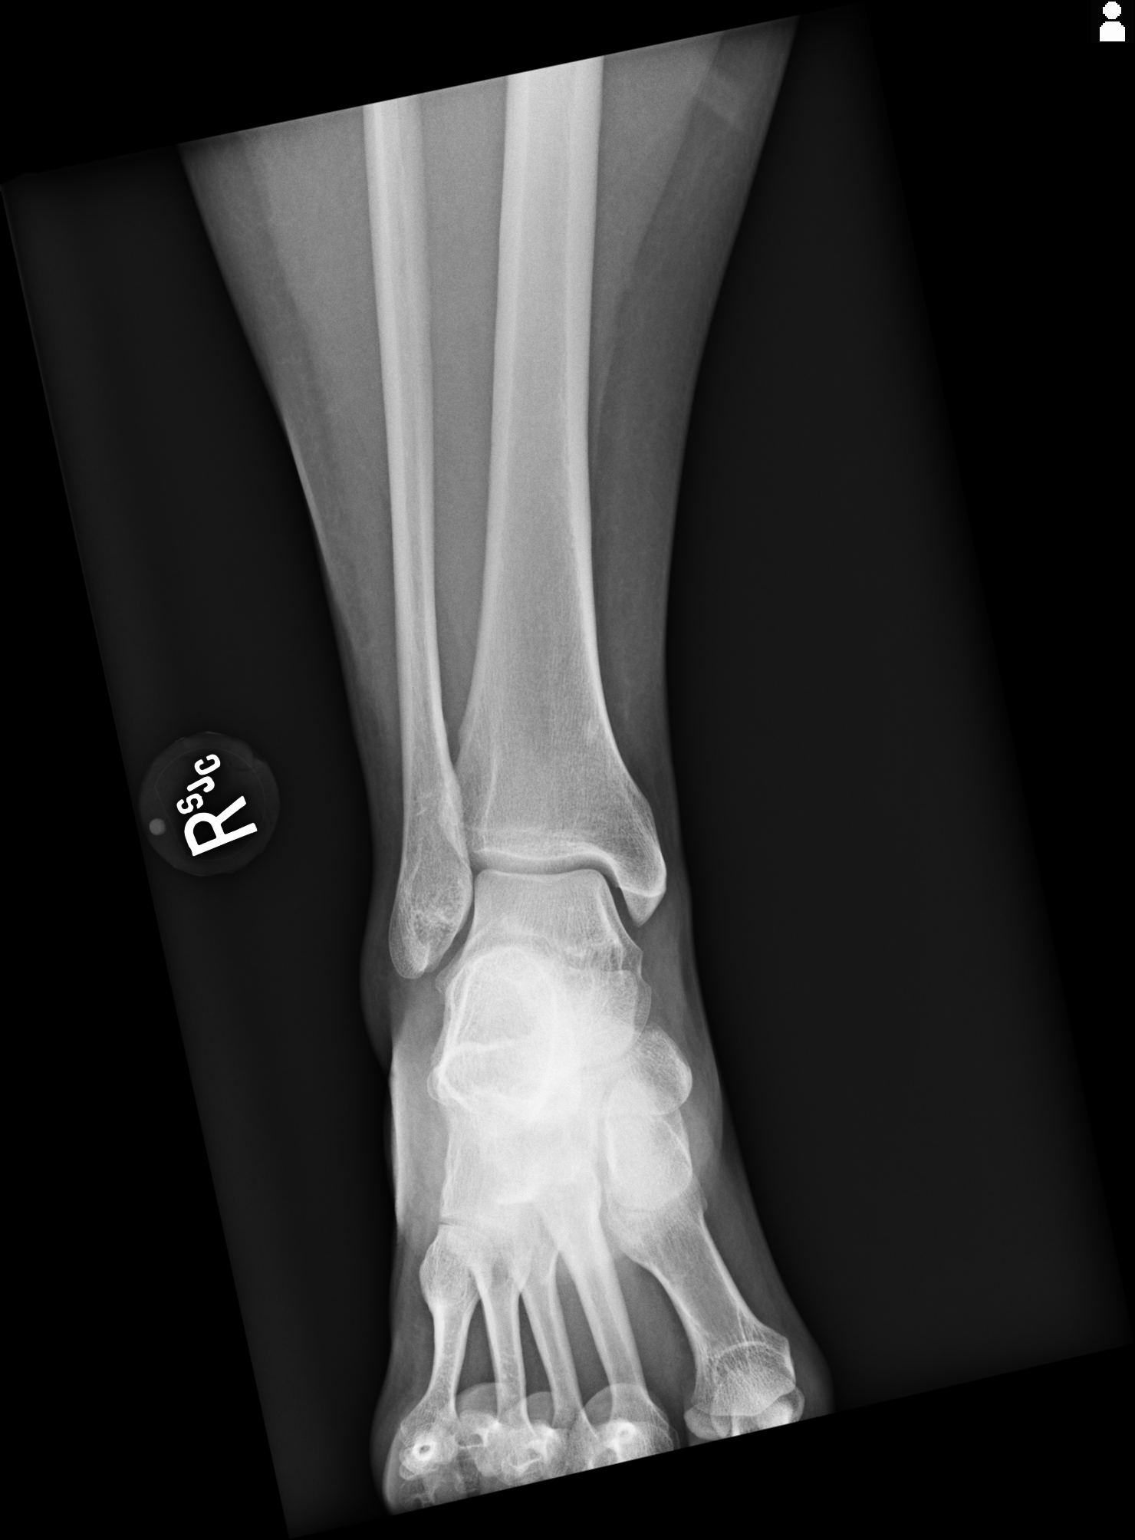
[im 2/3]
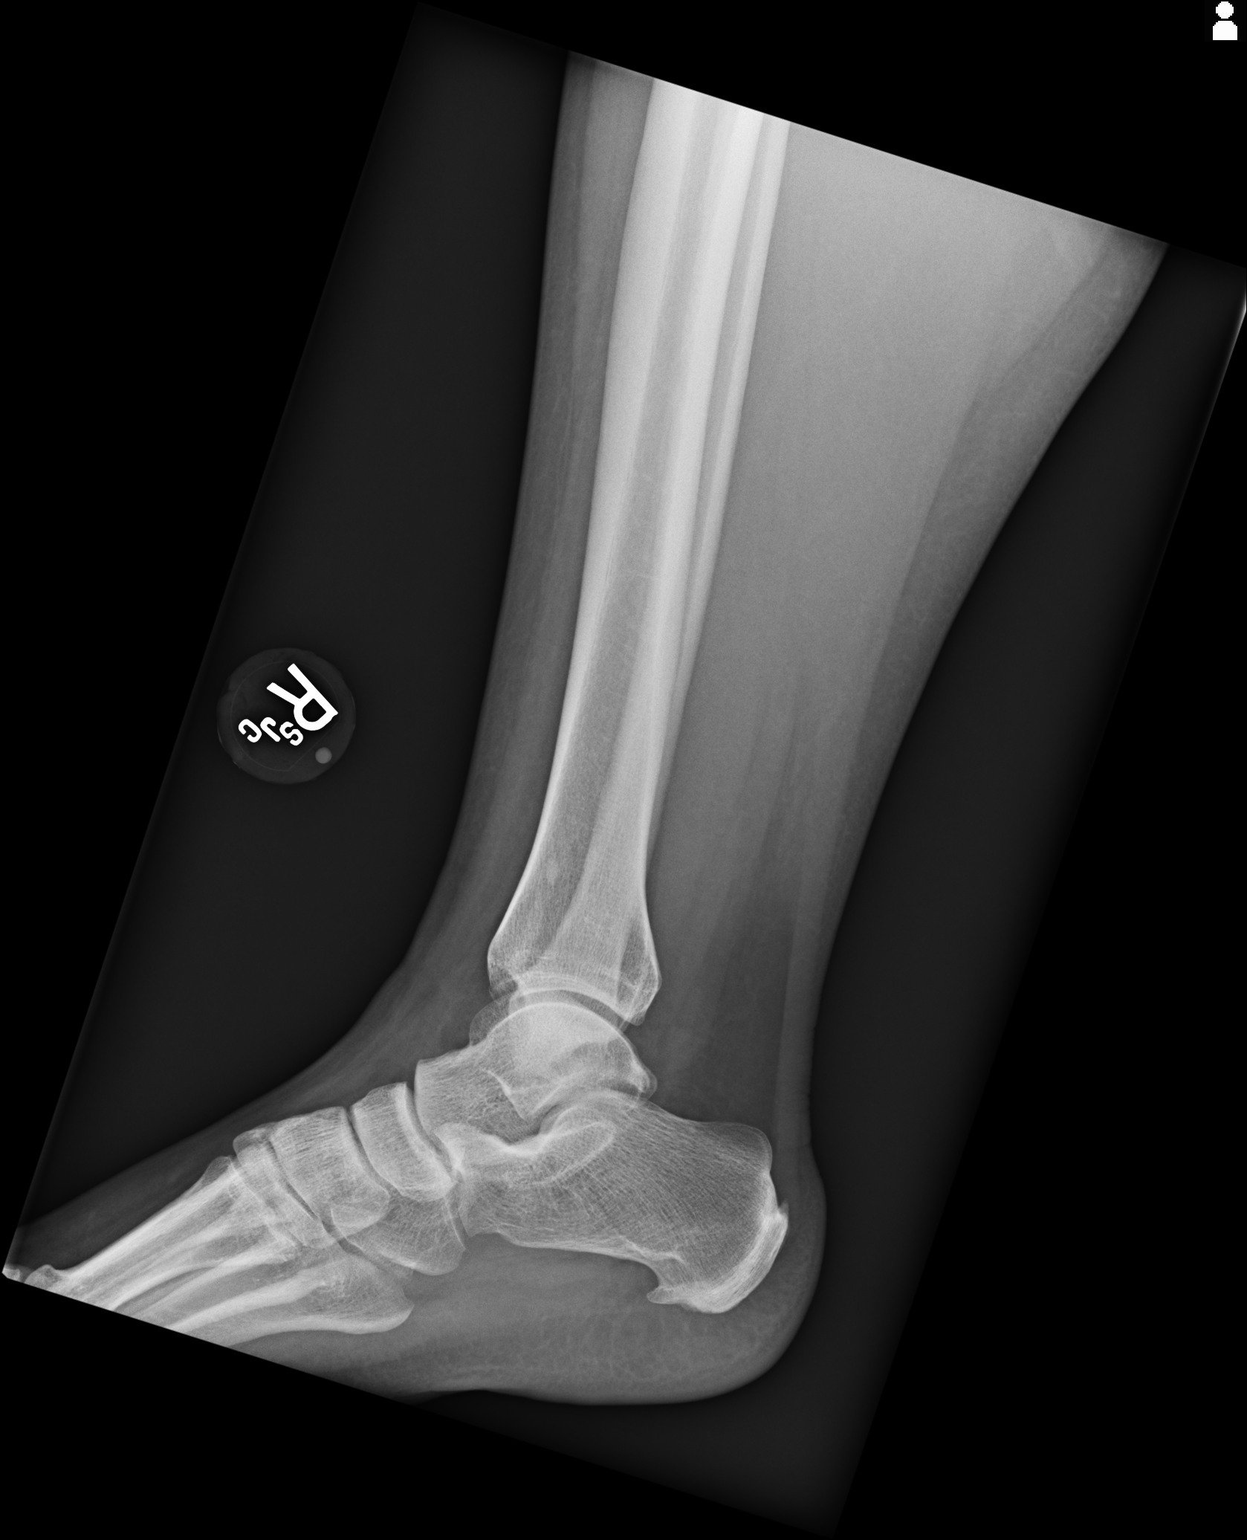
[im 3/3]
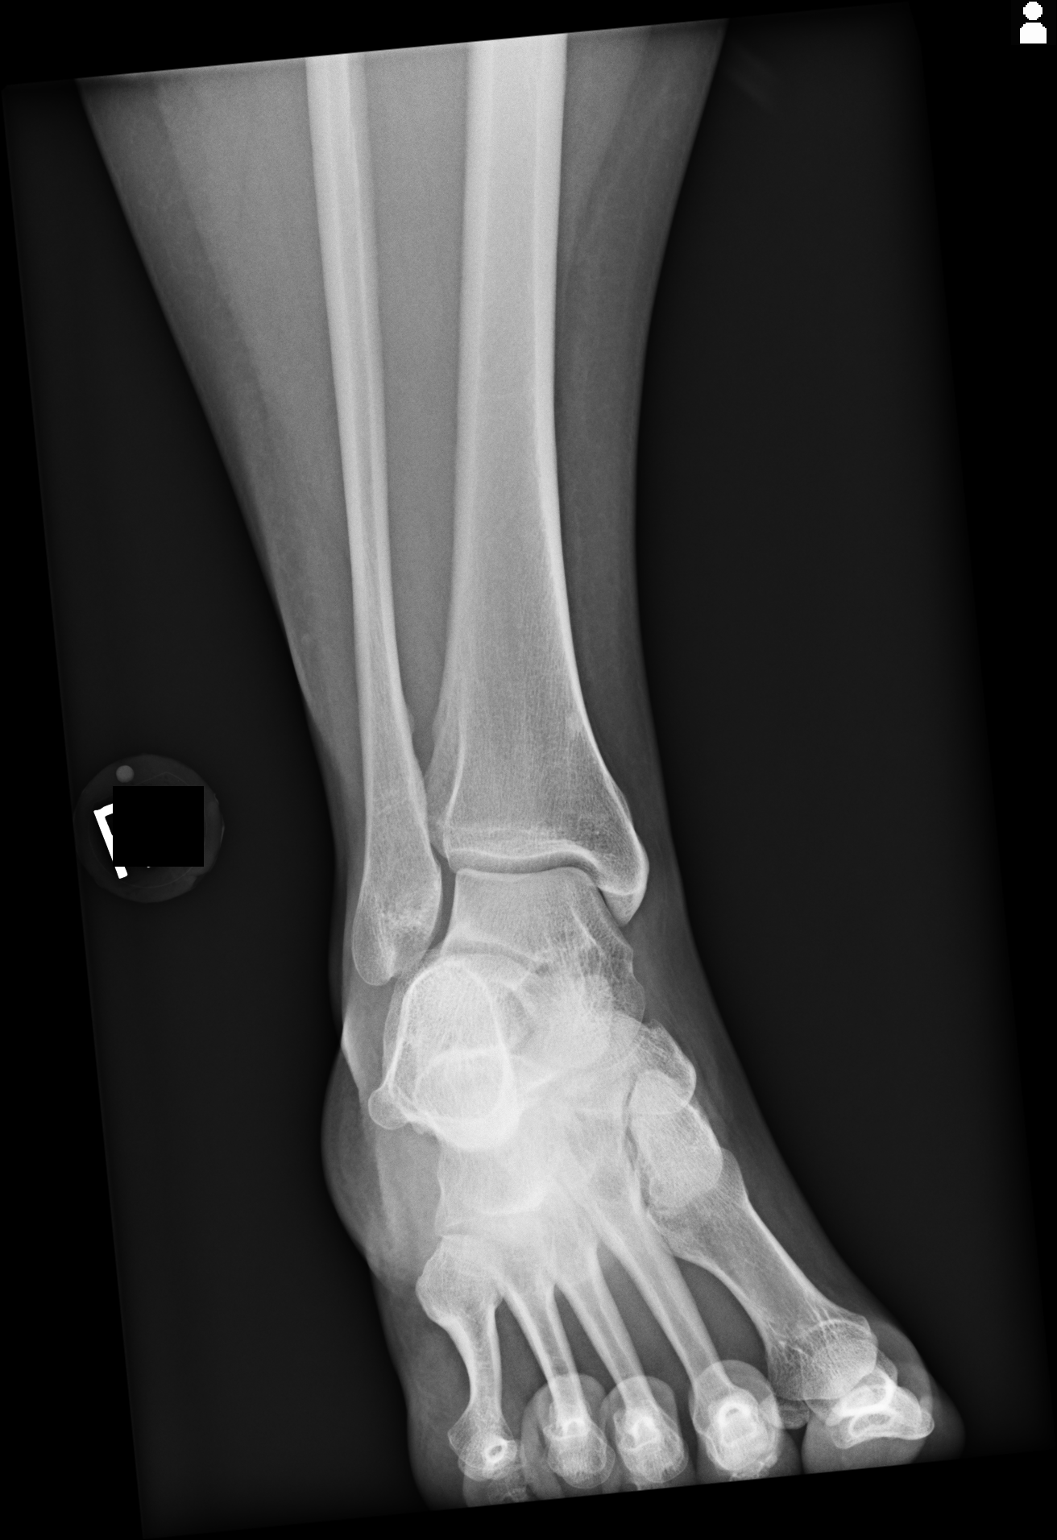

[3 of 3 positions shown; findings below may reference images not displayed]

FINDINGS: Ankle mortise intact. The talar dome is normal. No malleolar
fracture. The calcaneus is normal. Benign appearing sclerotic lesion
in the distal tibial metaphysis measures 4 mm. Mild spurring of the
calcaneus along the plantar surface.
IMPRESSION: No fracture or dislocation.

## 2016-02-23 ENCOUNTER — Ambulatory Visit (INDEPENDENT_AMBULATORY_CARE_PROVIDER_SITE_OTHER): Payer: BLUE CROSS/BLUE SHIELD | Admitting: Internal Medicine

## 2016-02-23 ENCOUNTER — Encounter: Payer: Self-pay | Admitting: Internal Medicine

## 2016-02-23 VITALS — BP 116/78 | HR 90 | Temp 98.2°F | Wt 226.5 lb

## 2016-02-23 DIAGNOSIS — J069 Acute upper respiratory infection, unspecified: Secondary | ICD-10-CM | POA: Diagnosis not present

## 2016-02-23 MED ORDER — HYDROCODONE-HOMATROPINE 5-1.5 MG/5ML PO SYRP: 5.0000 mL | ORAL_SOLUTION | Freq: Three times a day (TID) | ORAL | 0 refills | Status: DC | PRN

## 2016-02-23 MED ORDER — AZITHROMYCIN 250 MG PO TABS: ORAL_TABLET | ORAL | 0 refills | Status: DC

## 2016-02-23 NOTE — Progress Notes (Signed)
HPI  Pt presents to the clinic today with c/o headache, runny nose, sore throat and cough. This started 1 week ago. She is blowing clear mucous out of her nose. She is having difficulty swallowing. The cough is productive of yellow/green mucous. She denies fever but has had chills and body aches. She has tried Mucinex, Dayquil and Robitussin with some relief. She has a history of seasonal allergies but reports this is different. She has not had sick contacts. She has not had her flu shot this year.  Review of Systems        Past Medical History:  Diagnosis Date  . Frequent headaches   . GERD (gastroesophageal reflux disease)   . Herpes   . Seasonal allergies     Family History  Problem Relation Age of Onset  . Diabetes Mother   . Heart attack Maternal Aunt   . Heart attack Maternal Grandmother   . Diabetes Maternal Grandmother   . Hypertension Mother   . Breast cancer Paternal Grandmother   . Diabetes Maternal Aunt     Social History   Social History  . Marital status: Married    Spouse name: N/A  . Number of children: N/A  . Years of education: N/A   Occupational History  . Not on file.   Social History Main Topics  . Smoking status: Never Smoker  . Smokeless tobacco: Not on file  . Alcohol use No  . Drug use: No  . Sexual activity: Not on file   Other Topics Concern  . Not on file   Social History Narrative   Married.   3 children.    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   Enjoys going to comedy clubs, riding horses, spending time with family.     Allergies  Allergen Reactions  . Latex Hives and Swelling     Constitutional: Positive headache, fatigue. Denies fever or abrupt weight changes.  HEENT:  Positive runny nose, sore throat. Denies eye redness, eye pain, pressure behind the eyes, facial pain, nasal congestion, ear pain, ringing in the ears, wax buildup, or bloody nose. Respiratory: Positive cough. Denies difficulty breathing or shortness of  breath.  Cardiovascular: Denies chest pain, chest tightness, palpitations or swelling in the hands or feet.   No other specific complaints in a complete review of systems (except as listed in HPI above).  Objective:   BP 116/78   Pulse 90   Temp 98.2 F (36.8 C) (Oral)   Wt 226 lb 8 oz (102.7 kg)   LMP 01/28/2016   SpO2 98%   BMI 38.88 kg/m  Wt Readings from Last 3 Encounters:  02/23/16 226 lb 8 oz (102.7 kg)  09/29/15 221 lb 12.8 oz (100.6 kg)  08/17/15 223 lb 8 oz (101.4 kg)     General: Appears her stated age, ill appearing in NAD. HEENT: Head: normal shape and size, no sinus tenderness noted; Eyes: sclera white, no icterus, conjunctiva pink; Ears: Tm's gray and intact, normal light reflex; Nose: mucosa pink and moist, septum midline; Throat/Mouth: Teeth present, mucosa pink and moist, no exudate noted, no lesions or ulcerations noted.  Neck: Bilateral cervical lymphadenopathy.  Cardiovascular: Normal rate and rhythm. S1,S2 noted.  No murmur, rubs or gallops noted.  Pulmonary/Chest: Normal effort and positive vesicular breath sounds. No respiratory distress. No wheezes, rales or ronchi noted.       Assessment & Plan:   Upper Respiratory Infection:  RST: negative Get some rest and drink  plenty of water Do salt water gargles for the sore throat eRx for Azithromax x 5 days Rx for Hycodan cough syrup  RTC as needed or if symptoms persist.   Nicki ReaperBAITY, Tajah Noguchi, NP

## 2016-02-23 NOTE — Patient Instructions (Signed)
Upper Respiratory Infection, Adult Most upper respiratory infections (URIs) are caused by a virus. A URI affects the nose, throat, and upper air passages. The most common type of URI is often called "the common cold." Follow these instructions at home:  Take medicines only as told by your doctor.  Gargle warm saltwater or take cough drops to comfort your throat as told by your doctor.  Use a warm mist humidifier or inhale steam from a shower to increase air moisture. This may make it easier to breathe.  Drink enough fluid to keep your pee (urine) clear or pale yellow.  Eat soups and other clear broths.  Have a healthy diet.  Rest as needed.  Go back to work when your fever is gone or your doctor says it is okay.  You may need to stay home longer to avoid giving your URI to others.  You can also wear a face mask and wash your hands often to prevent spread of the virus.  Use your inhaler more if you have asthma.  Do not use any tobacco products, including cigarettes, chewing tobacco, or electronic cigarettes. If you need help quitting, ask your doctor. Contact a doctor if:  You are getting worse, not better.  Your symptoms are not helped by medicine.  You have chills.  You are getting more short of breath.  You have brown or red mucus.  You have yellow or brown discharge from your nose.  You have pain in your face, especially when you bend forward.  You have a fever.  You have puffy (swollen) neck glands.  You have pain while swallowing.  You have white areas in the back of your throat. Get help right away if:  You have very bad or constant:  Headache.  Ear pain.  Pain in your forehead, behind your eyes, and over your cheekbones (sinus pain).  Chest pain.  You have long-lasting (chronic) lung disease and any of the following:  Wheezing.  Long-lasting cough.  Coughing up blood.  A change in your usual mucus.  You have a stiff neck.  You have  changes in your:  Vision.  Hearing.  Thinking.  Mood. This information is not intended to replace advice given to you by your health care provider. Make sure you discuss any questions you have with your health care provider. Document Released: 09/11/2007 Document Revised: 11/26/2015 Document Reviewed: 06/30/2013 Elsevier Interactive Patient Education  2017 Elsevier Inc.  

## 2016-03-06 ENCOUNTER — Ambulatory Visit (INDEPENDENT_AMBULATORY_CARE_PROVIDER_SITE_OTHER): Payer: BLUE CROSS/BLUE SHIELD | Admitting: *Deleted

## 2016-03-06 ENCOUNTER — Telehealth: Payer: Self-pay | Admitting: *Deleted

## 2016-03-06 DIAGNOSIS — Z23 Encounter for immunization: Secondary | ICD-10-CM | POA: Diagnosis not present

## 2016-03-06 DIAGNOSIS — Z111 Encounter for screening for respiratory tuberculosis: Secondary | ICD-10-CM | POA: Diagnosis not present

## 2016-03-06 NOTE — Telephone Encounter (Signed)
Pt came in for PPD and flu shot. She has college physical form that needs to be completed from her 09/2015 cpx. The form requires a hearing test, she will bring info from her audiologist for that because she hard of hearing in one ear. I will place forms in your inbox.  She also brought in copy of old immunization records to be entered in medical record.

## 2016-03-07 NOTE — Telephone Encounter (Signed)
Form completed and placed in Chan's inbox. Looks like she needs a vision test to complete this form.

## 2016-03-08 LAB — TB SKIN TEST
Induration: 0 mm
TB Skin Test: NEGATIVE

## 2016-03-08 NOTE — Telephone Encounter (Signed)
Notified patient that form is ready for pick. Saw that patient had completed the vision during the nurse visit.   Form to complete and ready for pick up. Left form in the front office for pick up.

## 2016-06-10 ENCOUNTER — Other Ambulatory Visit: Payer: Self-pay | Admitting: Primary Care

## 2016-06-10 ENCOUNTER — Encounter: Payer: Self-pay | Admitting: Primary Care

## 2016-06-10 DIAGNOSIS — A6000 Herpesviral infection of urogenital system, unspecified: Secondary | ICD-10-CM

## 2016-06-10 MED ORDER — VALACYCLOVIR HCL 500 MG PO TABS: 500.0000 mg | ORAL_TABLET | Freq: Every day | ORAL | 0 refills | Status: DC

## 2016-06-19 ENCOUNTER — Encounter: Payer: Self-pay | Admitting: Primary Care

## 2016-12-16 ENCOUNTER — Encounter: Payer: Self-pay | Admitting: Primary Care

## 2017-05-26 DIAGNOSIS — Z8742 Personal history of other diseases of the female genital tract: Secondary | ICD-10-CM | POA: Insufficient documentation

## 2017-05-26 DIAGNOSIS — G43109 Migraine with aura, not intractable, without status migrainosus: Secondary | ICD-10-CM | POA: Insufficient documentation

## 2017-05-26 DIAGNOSIS — A6000 Herpesviral infection of urogenital system, unspecified: Secondary | ICD-10-CM | POA: Insufficient documentation

## 2018-02-16 ENCOUNTER — Emergency Department
Admission: EM | Admit: 2018-02-16 | Discharge: 2018-02-16 | Disposition: A | Discharge status: Home / Self Care | Payer: PRIVATE HEALTH INSURANCE | Attending: Emergency Medicine | Admitting: Emergency Medicine

## 2018-02-16 ENCOUNTER — Emergency Department: Payer: PRIVATE HEALTH INSURANCE

## 2018-02-16 ENCOUNTER — Other Ambulatory Visit: Payer: Self-pay

## 2018-02-16 DIAGNOSIS — R0981 Nasal congestion: Secondary | ICD-10-CM | POA: Diagnosis not present

## 2018-02-16 DIAGNOSIS — R070 Pain in throat: Secondary | ICD-10-CM | POA: Diagnosis not present

## 2018-02-16 DIAGNOSIS — R509 Fever, unspecified: Secondary | ICD-10-CM | POA: Diagnosis present

## 2018-02-16 DIAGNOSIS — R05 Cough: Secondary | ICD-10-CM | POA: Diagnosis not present

## 2018-02-16 DIAGNOSIS — J111 Influenza due to unidentified influenza virus with other respiratory manifestations: Secondary | ICD-10-CM | POA: Insufficient documentation

## 2018-02-16 DIAGNOSIS — M7918 Myalgia, other site: Secondary | ICD-10-CM | POA: Insufficient documentation

## 2018-02-16 DIAGNOSIS — R69 Illness, unspecified: Secondary | ICD-10-CM

## 2018-02-16 LAB — INFLUENZA PANEL BY PCR (TYPE A & B)
Influenza A By PCR: NEGATIVE
Influenza B By PCR: NEGATIVE

## 2018-02-16 MED ORDER — HYDROCOD POLST-CPM POLST ER 10-8 MG/5ML PO SUER
5.0000 mL | Freq: Once | ORAL | Status: AC
  Administered 2018-02-16: 5 mL via ORAL
  Filled 2018-02-16: qty 5

## 2018-02-16 MED ORDER — HYDROCOD POLST-CPM POLST ER 10-8 MG/5ML PO SUER: 5.0000 mL | Freq: Two times a day (BID) | ORAL | 0 refills | Status: DC

## 2018-02-16 NOTE — ED Notes (Signed)
Pt reports flu like symptoms since yesterday-cough, congestion, sore throat and fever 101 this am; pt talking in complete coherent sentences; ambulatory with steady gait;

## 2018-02-16 NOTE — ED Triage Notes (Signed)
Reports symptoms began yesterday - fever, cough, congestion.

## 2018-02-16 NOTE — ED Provider Notes (Signed)
[facility] Emergency Department Provider Note       Time seen: ----------------------------------------- 7:06 AM on 02/16/2018 -----------------------------------------   I have reviewed the triage vital signs and the nursing notes.  HISTORY   Chief Complaint Influenza   HPI April Buck is a 32 y.o. female with a history of headaches, GERD, seasonal allergy who presents to the ED for flulike symptoms that began yesterday with fever, cough and congestion.  She is also having body aches and sore throat.  She denies vomiting or diarrhea.  Past Medical History:  Diagnosis Date  . Frequent headaches   . GERD (gastroesophageal reflux disease)   . Herpes   . Seasonal allergies     Patient Active Problem List   Diagnosis Date Noted  . Preventative health care 09/29/2015  . Obesity (BMI 35.0-39.9 without comorbidity) 08/01/2015  . Palpitations 06/08/2015  . Irregular periods 06/08/2015  . Generalized anxiety disorder 06/08/2015    Past Surgical History:  Procedure Laterality Date  . CESAREAN SECTION    . CHOLECYSTECTOMY    . TUBAL LIGATION      Allergies Latex  Social History Social History   Tobacco Use  . Smoking status: Never Smoker  Substance Use Topics  . Alcohol use: No    Alcohol/week: 0.0 standard drinks  . Drug use: No   Review of Systems Constitutional: Positive for fevers, chills, body aches ENT: Positive for congestion and sore throat Cardiovascular: Negative for chest pain. Respiratory: Negative for shortness of breath. Gastrointestinal: Negative for abdominal pain, vomiting and diarrhea. Musculoskeletal: Negative for back pain. Skin: Negative for rash. Neurological: Negative for headaches, focal weakness or numbness.  All systems negative/normal/unremarkable except as stated in the HPI  ____________________________________________   PHYSICAL EXAM:  VITAL SIGNS: ED Triage Vitals  Enc Vitals Group     BP  02/16/18 0557 (!) 127/94     Pulse Rate 02/16/18 0557 (!) 107     Resp 02/16/18 0557 20     Temp 02/16/18 0557 98.4 F (36.9 C)     Temp Source 02/16/18 0557 Oral     SpO2 02/16/18 0557 96 %     Weight 02/16/18 0558 213 lb (96.6 kg)     Height 02/16/18 0558 5' 4.5" (1.638 m)     Head Circumference --      Peak Flow --      Pain Score 02/16/18 0636 6     Pain Loc --      Pain Edu? --      Excl. in GC? --    Constitutional: Alert and oriented. Well appearing and in no distress. Eyes: Conjunctivae are normal. Normal extraocular movements. ENT   Head: Normocephalic and atraumatic.   Nose: No congestion/rhinnorhea.   Mouth/Throat: Mucous membranes are moist.   Neck: No stridor. Cardiovascular: Normal rate, regular rhythm. No murmurs, rubs, or gallops. Respiratory: Normal respiratory effort without tachypnea nor retractions. Breath sounds are clear and equal bilaterally. No wheezes/rales/rhonchi. Gastrointestinal: Soft and nontender. Normal bowel sounds Musculoskeletal: Nontender with normal range of motion in extremities. No lower extremity tenderness nor edema. Neurologic:  Normal speech and language. No gross focal neurologic deficits are appreciated.  Skin:  Skin is warm, dry and intact. No rash noted. Psychiatric: Mood and affect are normal. Speech and behavior are normal.  ____________________________________________  ED COURSE:  As part of my medical decision making, I reviewed the following data within the electronic MEDICAL RECORD NUMBER History obtained from family if available, nursing notes,  old chart and ekg, as well as notes from prior ED visits. Patient presented for flulike symptoms, we will assess with labs and imaging as indicated at this time.   Procedures ____________________________________________   LABS (pertinent positives/negatives)  Labs Reviewed  INFLUENZA PANEL BY PCR (TYPE A & B)    RADIOLOGY  Chest x-ray Is  unremarkable ____________________________________________  DIFFERENTIAL DIAGNOSIS   URI, pneumonia, influenza, seasonal allergy  FINAL ASSESSMENT AND PLAN  Influenza-like illness   Plan: The patient had presented for flulike symptoms that began yesterday morning. Patient's labs were negative. Patient's imaging was also negative.  This appears to be an influenza-like illness but not influenza or pneumonia.  I will prescribe cough medicine and she is cleared for outpatient follow-up.   Ulice Dash, MD   Note: This note was generated in part or whole with voice recognition software. Voice recognition is usually quite accurate but there are transcription errors that can and very often do occur. I apologize for any typographical errors that were not detected and corrected.     Emily Filbert, MD 02/16/18 [id number]

## 2019-02-06 ENCOUNTER — Ambulatory Visit: Admit: 2019-02-06 | Payer: PRIVATE HEALTH INSURANCE | Admitting: Otolaryngology

## 2019-02-06 SURGERY — TONSILLECTOMY
Anesthesia: General

## 2019-03-29 IMAGING — CR DG CHEST 2V
2 series · 2 of 2 positions shown · non-contrast
Comparison: 04/28/2005

CLINICAL DATA: Cough and congestion for 2 days

EXAM:
CHEST - 2 VIEW

[chest pa]
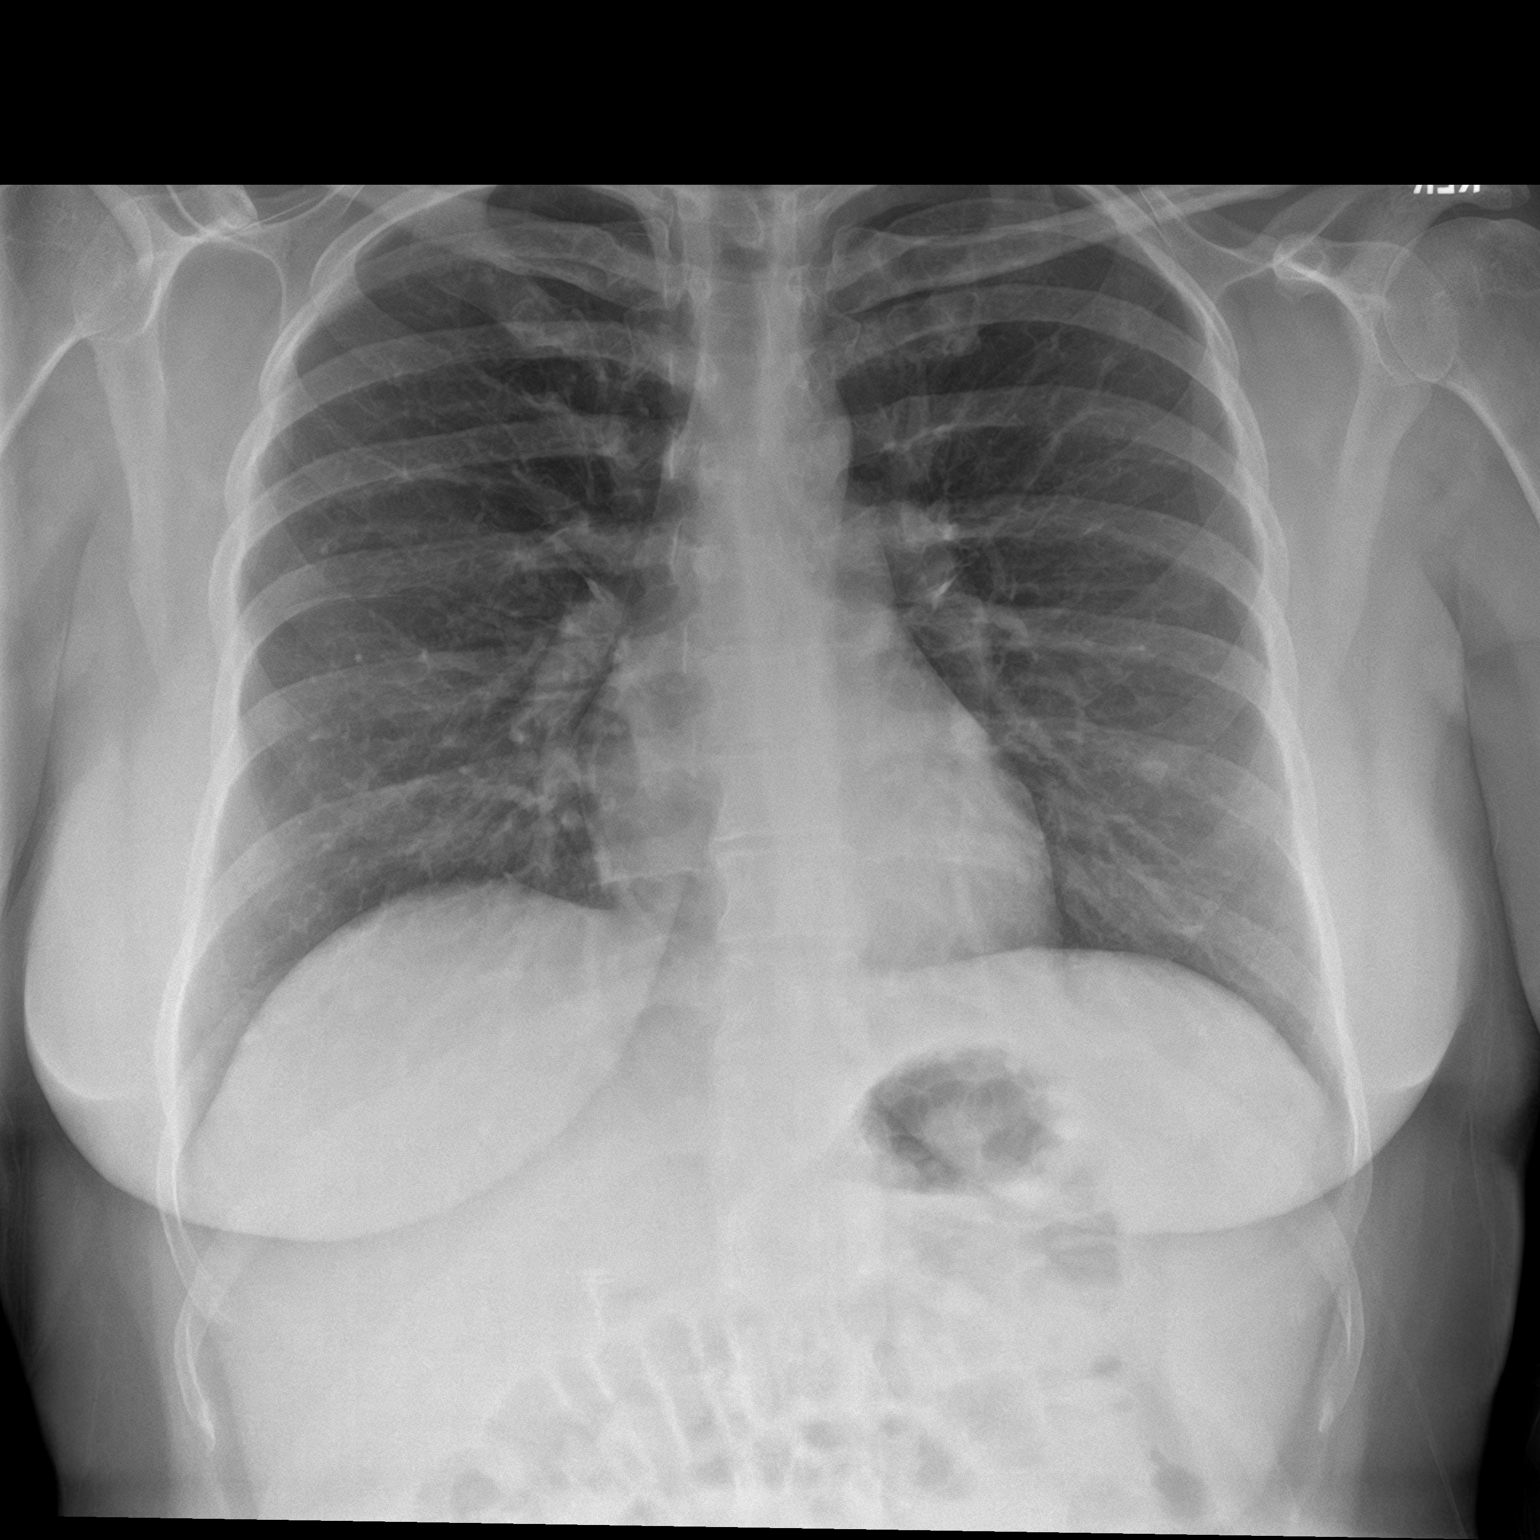

[chest lat]
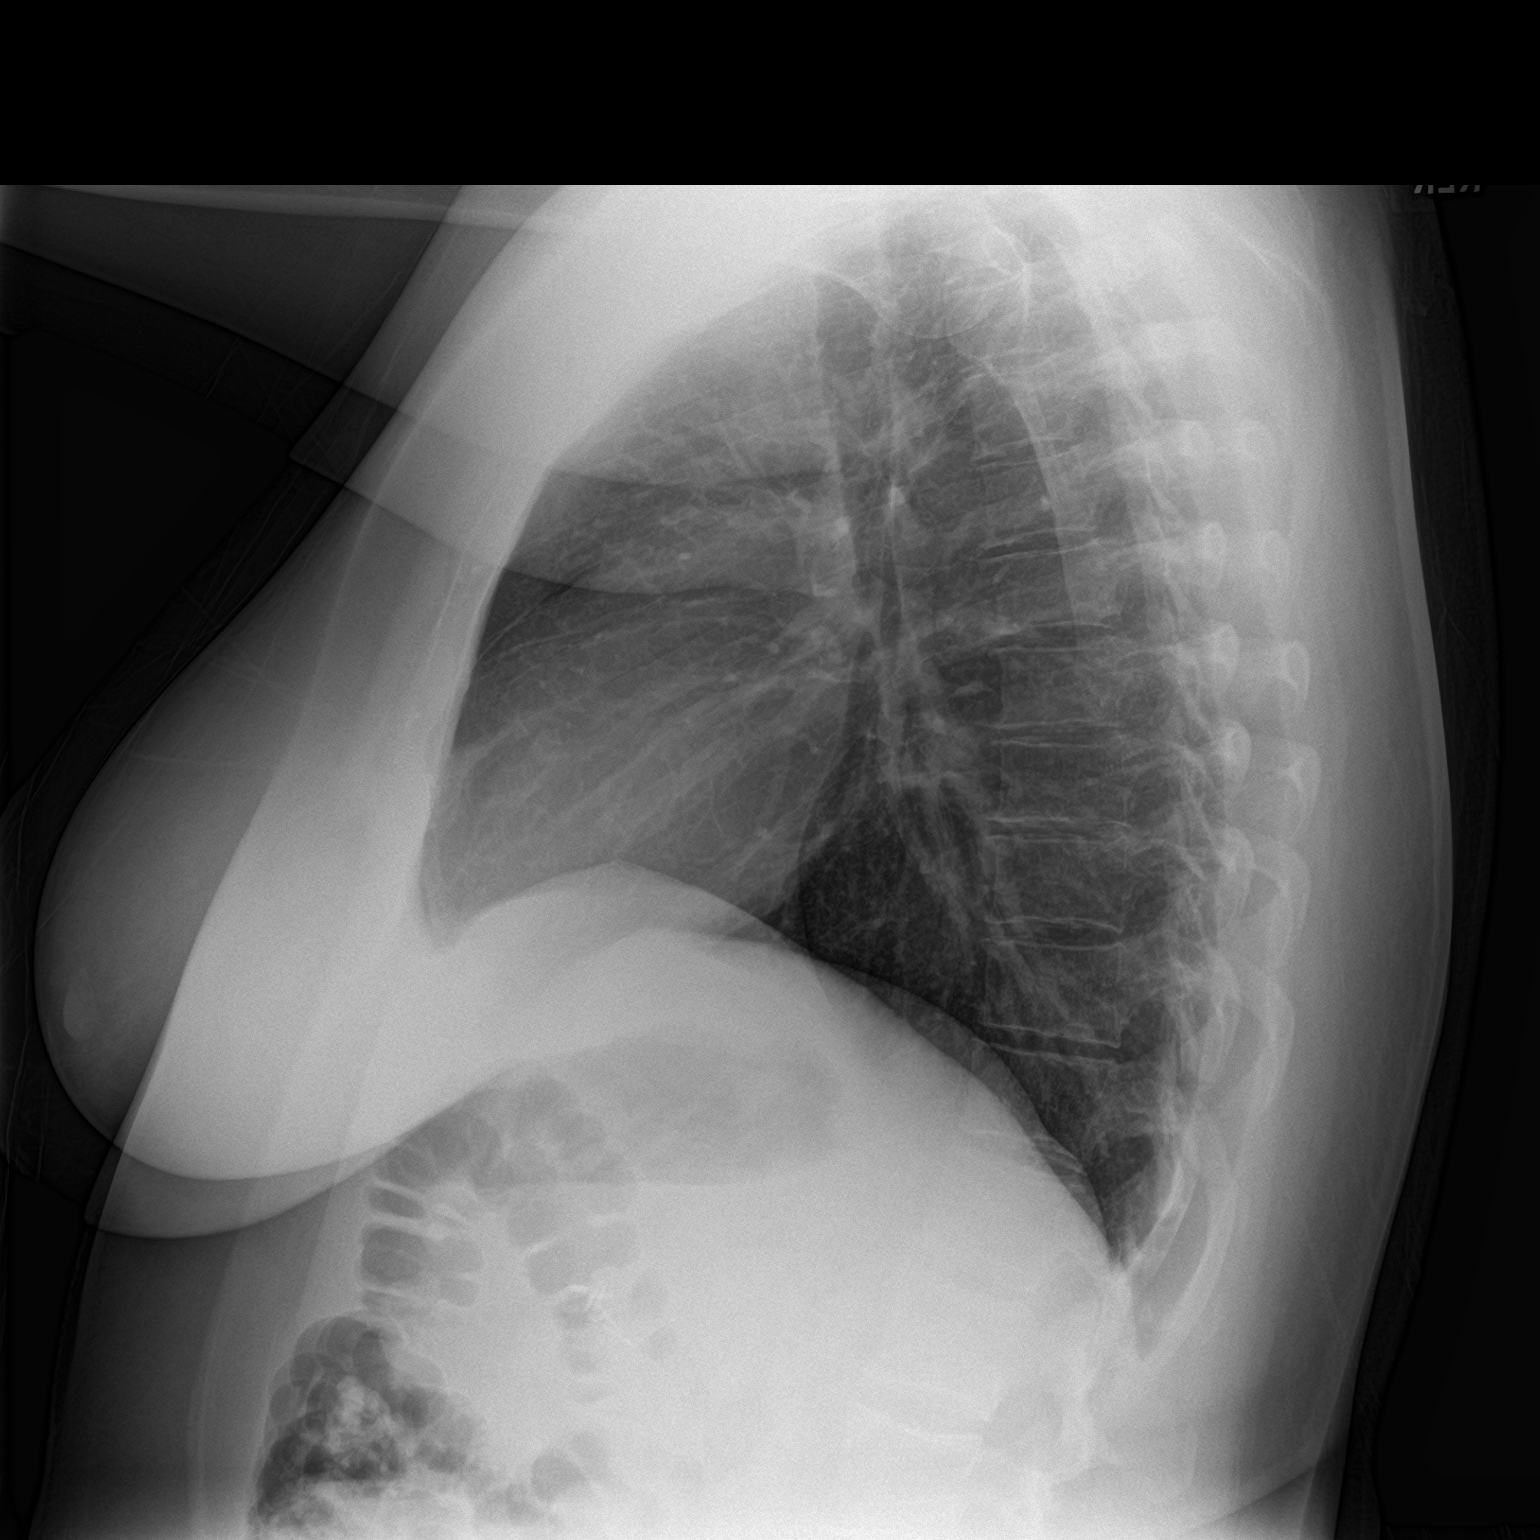

[2 of 2 positions shown; findings below may reference images not displayed]

FINDINGS: The heart size and mediastinal contours are within normal limits.
Both lungs are clear. The visualized skeletal structures are
unremarkable.
IMPRESSION: No active cardiopulmonary disease.

## 2019-09-21 ENCOUNTER — Other Ambulatory Visit: Payer: Self-pay | Admitting: Otolaryngology

## 2019-09-21 DIAGNOSIS — R42 Dizziness and giddiness: Secondary | ICD-10-CM

## 2019-10-04 ENCOUNTER — Ambulatory Visit: Payer: BC Managed Care – PPO

## 2020-04-05 ENCOUNTER — Ambulatory Visit: Admit: 2020-04-05 | Disposition: A | Discharge status: Home / Self Care | Payer: Self-pay

## 2020-04-05 ENCOUNTER — Ambulatory Visit
Admission: EM | Admit: 2020-04-05 | Discharge: 2020-04-05 | Disposition: A | Discharge status: Home / Self Care | Payer: BC Managed Care – PPO | Attending: Family Medicine | Admitting: Family Medicine

## 2020-04-05 DIAGNOSIS — A6000 Herpesviral infection of urogenital system, unspecified: Secondary | ICD-10-CM

## 2020-04-05 DIAGNOSIS — R059 Cough, unspecified: Secondary | ICD-10-CM | POA: Diagnosis not present

## 2020-04-05 DIAGNOSIS — B009 Herpesviral infection, unspecified: Secondary | ICD-10-CM

## 2020-04-05 MED ORDER — VALACYCLOVIR HCL 500 MG PO TABS: 500.0000 mg | ORAL_TABLET | Freq: Every day | ORAL | 1 refills | Status: DC

## 2020-04-05 NOTE — ED Triage Notes (Signed)
Pt reports having cough, nasal congestion and sore throat that began on Friday. sts the cough is worse at night. "feels like the congestion is in chest, more productive cough at night". Neg covid test on Monday. Also reports having cold sore and would like a rx for valacyclovir. sts she feels like there is a cold sore inside of mouth.

## 2020-04-05 NOTE — Discharge Instructions (Addendum)
Recommend Mucinex over-the-counter.  You can continue the Claritin-D Refilled your Valtrex Follow up as needed for continued or worsening symptoms

## 2020-04-06 NOTE — ED Provider Notes (Addendum)
April Buck    CSN: [id number] Arrival date & time: 04/05/20  1237      History   Chief Complaint Chief Complaint  Patient presents with  . Cough    HPI April Buck is a 34 y.o. female.   Patient is a 34 year old female presents today with cough, nasal ingestion, sore throat that started Friday.  The cough is worse at night and feels like she has more congestion in her chest.  The cough is more productive at night.  Negative Covid testing on Monday.  She also has recurrent herpes type I and II.  Needs refill on her valacyclovir.     Past Medical History:  Diagnosis Date  . Frequent headaches   . GERD (gastroesophageal reflux disease)   . Herpes   . Seasonal allergies     Patient Active Problem List   Diagnosis Date Noted  . Preventative health care 09/29/2015  . Obesity (BMI 35.0-39.9 without comorbidity) 08/01/2015  . Palpitations 06/08/2015  . Irregular periods 06/08/2015  . Generalized anxiety disorder 06/08/2015    Past Surgical History:  Procedure Laterality Date  . CESAREAN SECTION    . CHOLECYSTECTOMY    . TUBAL LIGATION      OB History   No obstetric history on file.      Home Medications    Prior to Admission medications   Medication Sig Start Date End Date Taking? Authorizing Provider  norgestimate-ethinyl estradiol (SPRINTEC 28) 0.25-35 MG-MCG tablet Take 1 tablet by mouth daily. 08/01/15   Doreene Nest, NP  valACYclovir (VALTREX) 500 MG tablet Take 1 tablet (500 mg total) by mouth daily. 04/05/20   Janace Aris, NP    Family History Family History  Problem Relation Age of Onset  . Diabetes Mother   . Hypertension Mother   . Heart attack Maternal Aunt   . Heart attack Maternal Grandmother   . Diabetes Maternal Grandmother   . Breast cancer Paternal Grandmother   . Diabetes Maternal Aunt     Social History Social History   Tobacco Use  . Smoking status: Never Smoker  . Smokeless tobacco: Never Used   Substance Use Topics  . Alcohol use: No    Alcohol/week: 0.0 standard drinks  . Drug use: No     Allergies   Latex   Review of Systems Review of Systems   Physical Exam Triage Vital Signs ED Triage Vitals  Enc Vitals Group     BP 04/05/20 1551 117/78     Pulse Rate 04/05/20 1551 96     Resp 04/05/20 1551 16     Temp 04/05/20 1551 98.3 F (36.8 C)     Temp Source 04/05/20 1551 Oral     SpO2 04/05/20 1551 95 %     Weight 04/05/20 1556 240 lb (108.9 kg)     Height 04/05/20 1556 5\' 4"  (1.626 m)     Head Circumference --      Peak Flow --      Pain Score 04/05/20 1556 0     Pain Loc --      Pain Edu? --      Excl. in GC? --    No data found.  Updated Vital Signs BP 117/78 (BP Location: Left Arm)   Pulse 96   Temp 98.3 F (36.8 C) (Oral)   Resp 16   Ht 5\' 4"  (1.626 m)   Wt 240 lb (108.9 kg)   SpO2 95%   BMI 41.20  kg/m   Visual Acuity Right Eye Distance:   Left Eye Distance:   Bilateral Distance:    Right Eye Near:   Left Eye Near:    Bilateral Near:     Physical Exam Vitals and nursing note reviewed.  Constitutional:      General: She is not in acute distress.    Appearance: Normal appearance. She is not ill-appearing, toxic-appearing or diaphoretic.  HENT:     Head: Normocephalic.     Right Ear: Tympanic membrane and ear canal normal.     Left Ear: Tympanic membrane and ear canal normal.     Nose: Congestion present.     Mouth/Throat:     Pharynx: Oropharynx is clear.  Eyes:     Conjunctiva/sclera: Conjunctivae normal.  Cardiovascular:     Rate and Rhythm: Normal rate and regular rhythm.  Pulmonary:     Effort: Pulmonary effort is normal.     Breath sounds: Normal breath sounds.  Musculoskeletal:        General: Normal range of motion.     Cervical back: Normal range of motion.  Skin:    General: Skin is warm and dry.     Findings: Rash present.  Neurological:     Mental Status: She is alert.  Psychiatric:        Mood and Affect: Mood  normal.      UC Treatments / Results  Labs (all labs ordered are listed, but only abnormal results are displayed) Labs Reviewed - No data to display  EKG   Radiology No results found.  Procedures Procedures (including critical care time)  Medications Ordered in UC Medications - No data to display  Initial Impression / Assessment and Plan / UC Course  I have reviewed the triage vital signs and the nursing notes.  Pertinent labs & imaging results that were available during my care of the patient were reviewed by me and considered in my medical decision making (see chart for details).     Cough Most likely viral.  Recommended Mucinex over-the-counter.  She can continue the Claritin-D.  HSV1 Patient with active rash  refilled valacyclovir as requested Final Clinical Impressions(s) / UC Diagnoses   Final diagnoses:  Cough  HSV-1 infection     Discharge Instructions     Recommend Mucinex over-the-counter.  You can continue the Claritin-D Refilled your Valtrex Follow up as needed for continued or worsening symptoms     ED Prescriptions    Medication Sig Dispense Auth. Provider   valACYclovir (VALTREX) 500 MG tablet Take 1 tablet (500 mg total) by mouth daily. 30 tablet Dahlia Byes A, NP     PDMP not reviewed this encounter.   Janace Aris, NP 04/06/20 1023    Dahlia Byes A, NP 04/06/20 1024

## 2021-02-18 ENCOUNTER — Encounter: Payer: Self-pay | Admitting: Emergency Medicine

## 2021-02-18 ENCOUNTER — Ambulatory Visit
Admission: EM | Admit: 2021-02-18 | Discharge: 2021-02-18 | Disposition: A | Discharge status: Home / Self Care | Payer: 59 | Attending: Emergency Medicine | Admitting: Emergency Medicine

## 2021-02-18 ENCOUNTER — Other Ambulatory Visit: Payer: Self-pay

## 2021-02-18 DIAGNOSIS — J208 Acute bronchitis due to other specified organisms: Secondary | ICD-10-CM | POA: Diagnosis not present

## 2021-02-18 MED ORDER — PROMETHAZINE-DM 6.25-15 MG/5ML PO SYRP: 5.0000 mL | ORAL_SOLUTION | Freq: Four times a day (QID) | ORAL | 0 refills | Status: AC | PRN

## 2021-02-18 MED ORDER — ALBUTEROL SULFATE HFA 108 (90 BASE) MCG/ACT IN AERS: 1.0000 | INHALATION_SPRAY | Freq: Four times a day (QID) | RESPIRATORY_TRACT | 0 refills | Status: DC | PRN

## 2021-02-18 MED ORDER — PREDNISONE 10 MG PO TABS: ORAL_TABLET | ORAL | 0 refills | Status: AC

## 2021-02-18 NOTE — Discharge Instructions (Addendum)
Take prednisone and use albuterol as prescribed  Use cough syrup as prescribed  Rest, push lots of fluids (especially water), and utilize supportive care for symptoms. You may take take acetaminophen (Tylenol) every 4-6 hours or ibuprofen every 6-8 hours for muscle pain, joint pain, headaches. Mucinex (guaifenesin) may be taken over the counter for cough as needed and can loosen phlegm. Please read the instructions and take as directed. Saline nasal sprays to rinse congestion can help as well. Warm tea with lemon and honey can sooth sore throat and cough, as can cough drops.  Take Mucinex for cough.  The normal course of bronchitis is 1-3 weeks. Return to clinic for new-onset fever, difficulty breathing, chest pain, symptoms lasting >3 to 4 weeks, or bloody sputum.

## 2021-02-18 NOTE — ED Provider Notes (Signed)
CHIEF COMPLAINT:   Chief Complaint  Patient presents with   Cough     SUBJECTIVE/HPI:   Cough A very pleasant 35 y.o.Female presents today with cough for 1 week. Patient does not report any shortness of breath, chest pain, palpitations, visual changes, weakness, tingling, headache, nausea, vomiting, diarrhea, fever, chills.   has a past medical history of Frequent headaches, GERD (gastroesophageal reflux disease), Herpes, and Seasonal allergies.  ROS:  Review of Systems  Respiratory:  Positive for cough.   See Subjective/HPI Medications, Allergies and Problem List personally reviewed in Epic today OBJECTIVE:   Vitals:   02/18/21 0834  BP: 133/76  Pulse: 98  Temp: 98.5 F (36.9 C)  SpO2: 98%    Physical Exam   General: Appears well-developed and well-nourished. No acute distress.  HEENT Head: Normocephalic and atraumatic.   Ears: Hearing grossly intact, no drainage or visible deformity.  Nose: No nasal deviation.   Mouth/Throat: No stridor or tracheal deviation.  Non erythematous posterior pharynx noted with clear drainage present.  No white patchy exudate noted. Eyes: Conjunctivae and EOM are normal. No eye drainage or scleral icterus bilaterally.  Neck: Normal range of motion, neck is supple.  Cardiovascular: Normal rate. Regular rhythm; no murmurs, gallops, or rubs.  Pulm/Chest: No respiratory distress. Breath sounds normal bilaterally without wheezes, rhonchi, or rales.  Constant forceful cough. Neurological: Alert and oriented to person, place, and time.  Skin: Skin is warm and dry.  No rashes, lesions, abrasions or bruising noted to skin.   Psychiatric: Normal mood, affect, behavior, and thought content.   Vital signs and nursing note reviewed.   Patient stable and cooperative with examination. PROCEDURES:    LABS/X-RAYS/EKG/MEDS:   No results found for any visits on 02/18/21.  MEDICAL DECISION MAKING:   Patient presents with cough for 1 week. Patient  does not report any shortness of breath, chest pain, palpitations, visual changes, weakness, tingling, headache, nausea, vomiting, diarrhea, fever, chills.  Given symptoms along with assessment findings, likely viral bronchitis.  Rx'd prednisone, albuterol inhaler, Phenergan cough syrup to the patient's preferred pharmacy.  Advised rest, fluids, Tylenol, ibuprofen and Mucinex.  Return to clinic with any new onset fever, difficulty breathing, chest pain, symptoms lasting longer than 3 to 4 weeks or bloody sputum.  Patient verbalized understanding and agreed with treatment plan.  Patient stable upon discharge. ASSESSMENT/PLAN:  1. Viral bronchitis - predniSONE (DELTASONE) 10 MG tablet; Take 6 tablets (60 mg total) by mouth daily for 1 day, THEN 5 tablets (50 mg total) daily for 1 day, THEN 4 tablets (40 mg total) daily for 1 day, THEN 3 tablets (30 mg total) daily for 1 day, THEN 2 tablets (20 mg total) daily for 1 day, THEN 1 tablet (10 mg total) daily for 1 day.  Dispense: 21 tablet; Refill: 0 - promethazine-dextromethorphan (PROMETHAZINE-DM) 6.25-15 MG/5ML syrup; Take 5 mLs by mouth 4 (four) times daily as needed for up to 7 days for cough.  Dispense: 140 mL; Refill: 0 - albuterol (VENTOLIN HFA) 108 (90 Base) MCG/ACT inhaler; Inhale 1-2 puffs into the lungs every 6 (six) hours as needed (cough).  Dispense: 6.7 g; Refill: 0 Instructions about new medications and side effects provided.  Plan:   Discharge Instructions      Take prednisone and use albuterol as prescribed  Use cough syrup as prescribed  Rest, push lots of fluids (especially water), and utilize supportive care for symptoms. You may take take acetaminophen (Tylenol) every 4-6 hours or ibuprofen every 6-8  hours for muscle pain, joint pain, headaches. Mucinex (guaifenesin) may be taken over the counter for cough as needed and can loosen phlegm. Please read the instructions and take as directed. Saline nasal sprays to rinse congestion can  help as well. Warm tea with lemon and honey can sooth sore throat and cough, as can cough drops.  Take Mucinex for cough.  The normal course of bronchitis is 1-3 weeks. Return to clinic for new-onset fever, difficulty breathing, chest pain, symptoms lasting >3 to 4 weeks, or bloody sputum.          Amalia Greenhouse, [place] 02/18/21 [phone]

## 2021-02-18 NOTE — ED Triage Notes (Signed)
Pt presents with cough x 1 week.  

## 2021-05-16 ENCOUNTER — Telehealth: Payer: Self-pay

## 2021-05-16 NOTE — Telephone Encounter (Signed)
Called the phonenumber that is listed 2 times and the lady that answered said that this was a business number and she did not know anyone by that name.

## 2021-05-16 NOTE — Telephone Encounter (Signed)
Copied from [place] [id number]. Topic: Appointment Scheduling - Scheduling Inquiry for Clinic >> May 15, 2021 12:46 PM Bayard Beaver wrote: Reason for [id number] called in to schedule new pt appt. Please call back

## 2021-06-11 ENCOUNTER — Encounter: Payer: Self-pay | Admitting: Emergency Medicine

## 2021-06-11 ENCOUNTER — Other Ambulatory Visit: Payer: Self-pay

## 2021-06-11 ENCOUNTER — Ambulatory Visit
Admission: EM | Admit: 2021-06-11 | Discharge: 2021-06-11 | Disposition: A | Discharge status: Home / Self Care | Payer: 59 | Attending: Emergency Medicine | Admitting: Emergency Medicine

## 2021-06-11 DIAGNOSIS — J029 Acute pharyngitis, unspecified: Secondary | ICD-10-CM

## 2021-06-11 DIAGNOSIS — J209 Acute bronchitis, unspecified: Secondary | ICD-10-CM

## 2021-06-11 LAB — POCT RAPID STREP A (OFFICE): Rapid Strep A Screen: NEGATIVE

## 2021-06-11 MED ORDER — PREDNISONE 10 MG (21) PO TBPK: ORAL_TABLET | Freq: Every day | ORAL | 0 refills | Status: DC

## 2021-06-11 MED ORDER — ALBUTEROL SULFATE HFA 108 (90 BASE) MCG/ACT IN AERS: 1.0000 | INHALATION_SPRAY | Freq: Four times a day (QID) | RESPIRATORY_TRACT | 0 refills | Status: DC | PRN

## 2021-06-11 MED ORDER — AZITHROMYCIN 250 MG PO TABS: 250.0000 mg | ORAL_TABLET | Freq: Every day | ORAL | 0 refills | Status: DC

## 2021-06-11 MED ORDER — LIDOCAINE VISCOUS HCL 2 % MT SOLN: 15.0000 mL | OROMUCOSAL | 0 refills | Status: DC | PRN

## 2021-06-11 NOTE — ED Provider Notes (Signed)
?[facility] ? ? ? ?CSN: [id number] ?Arrival date & time: 06/11/21  0807 ? ? ?  ? ?History   ?Chief Complaint ?Chief Complaint  ?Patient presents with  ? Sore Throat  ? ? ?HPI ?April Buck is a 36 y.o. female.  Patient presents with sore throat x2 days which is getting worse.  She also reports nonproductive cough, nasal congestion, chest congestion for several days.  No fever, rash, shortness of breath, vomiting, diarrhea, or other symptoms.  Several OTC cold and sinus medications attempted without relief.  She states her husband and children have had similar symptoms, were seen here last week and treated with Zithromax and prednisone.  She denies current pregnancy or breastfeeding.   ? ? ?The history is provided by the patient and medical records.  ? ?Past Medical History:  ?Diagnosis Date  ? Frequent headaches   ? GERD (gastroesophageal reflux disease)   ? Herpes   ? Seasonal allergies   ? ? ?Patient Active Problem List  ? Diagnosis Date Noted  ? Preventative health care 09/29/2015  ? Obesity (BMI 35.0-39.9 without comorbidity) 08/01/2015  ? Palpitations 06/08/2015  ? Irregular periods 06/08/2015  ? Generalized anxiety disorder 06/08/2015  ? ? ?Past Surgical History:  ?Procedure Laterality Date  ? CESAREAN SECTION    ? CHOLECYSTECTOMY    ? TUBAL LIGATION    ? ? ?OB History   ?No obstetric history on file. ?  ? ? ? ?Home Medications   ? ?Prior to Admission medications   ?Medication Sig Start Date End Date Taking? Authorizing Provider  ?albuterol (VENTOLIN HFA) 108 (90 Base) MCG/ACT inhaler Inhale 1-2 puffs into the lungs every 6 (six) hours as needed for wheezing or shortness of breath. 06/11/21  Yes Mickie Bail, NP  ?azithromycin (ZITHROMAX) 250 MG tablet Take 1 tablet (250 mg total) by mouth daily. Take first 2 tablets together, then 1 every day until finished. 06/11/21  Yes Mickie Bail, NP  ?lidocaine (XYLOCAINE) 2 % solution Use as directed 15 mLs in the mouth or throat as needed for mouth pain.  06/11/21  Yes Mickie Bail, NP  ?predniSONE (STERAPRED UNI-PAK 21 TAB) 10 MG (21) TBPK tablet Take by mouth daily. As directed 06/11/21  Yes Mickie Bail, NP  ?norgestimate-ethinyl estradiol (SPRINTEC 28) 0.25-35 MG-MCG tablet Take 1 tablet by mouth daily. 08/01/15   Doreene Nest, NP  ?valACYclovir (VALTREX) 500 MG tablet Take 1 tablet (500 mg total) by mouth daily. 04/05/20   Janace Aris, NP  ? ? ?Family History ?Family History  ?Problem Relation Age of Onset  ? Diabetes Mother   ? Hypertension Mother   ? Heart attack Maternal Aunt   ? Heart attack Maternal Grandmother   ? Diabetes Maternal Grandmother   ? Breast cancer Paternal Grandmother   ? Diabetes Maternal Aunt   ? ? ?Social History ?Social History  ? ?Tobacco Use  ? Smoking status: Never  ? Smokeless tobacco: Never  ?Vaping Use  ? Vaping Use: Never used  ?Substance Use Topics  ? Alcohol use: No  ?  Alcohol/week: 0.0 standard drinks  ? Drug use: No  ? ? ? ?Allergies   ?Latex ? ? ?Review of Systems ?Review of Systems  ?Constitutional:  Negative for chills and fever.  ?HENT:  Positive for congestion and sore throat. Negative for ear pain.   ?Respiratory:  Positive for cough. Negative for shortness of breath.   ?Cardiovascular:  Negative for chest pain and palpitations.  ?  Gastrointestinal:  Negative for diarrhea and vomiting.  ?Skin:  Negative for color change and rash.  ?All other systems reviewed and are negative. ? ? ?Physical Exam ?Triage Vital Signs ?ED Triage Vitals  ?Enc Vitals Group  ?   BP 06/11/21 0815 136/87  ?   Pulse Rate 06/11/21 0815 95  ?   Resp 06/11/21 0815 18  ?   Temp 06/11/21 0815 98.2 ?F (36.8 ?C)  ?   Temp src --   ?   SpO2 --   ?   Weight --   ?   Height --   ?   Head Circumference --   ?   Peak Flow --   ?   Pain Score 06/11/21 0817 10  ?   Pain Loc --   ?   Pain Edu? --   ?   Excl. in GC? --   ? ?No data found. ? ?Updated Vital Signs ?BP 136/87   Pulse 95   Temp 98.2 ?F (36.8 ?C)   Resp 18   LMP 05/22/2021 (Approximate)   ? ?Visual Acuity ?Right Eye Distance:   ?Left Eye Distance:   ?Bilateral Distance:   ? ?Right Eye Near:   ?Left Eye Near:    ?Bilateral Near:    ? ?Physical Exam ?Vitals and nursing note reviewed.  ?Constitutional:   ?   General: She is not in acute distress. ?   Appearance: She is well-developed. She is not ill-appearing.  ?HENT:  ?   Right Ear: Tympanic membrane normal.  ?   Left Ear: Tympanic membrane normal.  ?   Nose: Congestion present.  ?   Mouth/Throat:  ?   Mouth: Mucous membranes are moist.  ?   Pharynx: Posterior oropharyngeal erythema present.  ?Cardiovascular:  ?   Rate and Rhythm: Normal rate and regular rhythm.  ?   Heart sounds: Normal heart sounds.  ?Pulmonary:  ?   Effort: Pulmonary effort is normal. No respiratory distress.  ?   Breath sounds: Normal breath sounds.  ?   Comments: Nonproductive cough during exam.  ?Musculoskeletal:  ?   Cervical back: Neck supple.  ?Skin: ?   General: Skin is warm and dry.  ?Neurological:  ?   Mental Status: She is alert.  ?Psychiatric:     ?   Mood and Affect: Mood normal.     ?   Behavior: Behavior normal.  ? ? ? ?UC Treatments / Results  ?Labs ?(all labs ordered are listed, but only abnormal results are displayed) ?Labs Reviewed  ?POCT RAPID STREP A (OFFICE)  ? ? ?EKG ? ? ?Radiology ?No results found. ? ?Procedures ?Procedures (including critical care time) ? ?Medications Ordered in UC ?Medications - No data to display ? ?Initial Impression / Assessment and Plan / UC Course  ?I have reviewed the triage vital signs and the nursing notes. ? ?Pertinent labs & imaging results that were available during my care of the patient were reviewed by me and considered in my medical decision making (see chart for details). ? ?  ?Acute bronchitis, Sore throat.  Rapid strep negative.  Patient declines COVID test. Treating with albuterol inhaler, prednisone, Zithromax, and viscous lidocaine.  Instructed patient to follow up with her PCP if her symptoms are not improving.  She  agrees to plan of care.  ? ? ?Final Clinical Impressions(s) / UC Diagnoses  ? ?Final diagnoses:  ?Acute bronchitis, unspecified organism  ?Sore throat  ? ? ? ?Discharge Instructions   ? ?  ?  Follow up with your primary care provider if your symptoms are not improving.   ? ? ? ? ? ?ED Prescriptions   ? ? Medication Sig Dispense Auth. Provider  ? albuterol (VENTOLIN HFA) 108 (90 Base) MCG/ACT inhaler Inhale 1-2 puffs into the lungs every 6 (six) hours as needed for wheezing or shortness of breath. 18 g Mickie Bail, NP  ? predniSONE (STERAPRED UNI-PAK 21 TAB) 10 MG (21) TBPK tablet Take by mouth daily. As directed 21 tablet Mickie Bail, NP  ? azithromycin (ZITHROMAX) 250 MG tablet Take 1 tablet (250 mg total) by mouth daily. Take first 2 tablets together, then 1 every day until finished. 6 tablet Mickie Bail, NP  ? lidocaine (XYLOCAINE) 2 % solution Use as directed 15 mLs in the mouth or throat as needed for mouth pain. 100 mL Mickie Bail, NP  ? ?  ? ?PDMP not reviewed this encounter. ?  ?Mickie Bail, NP ?06/11/21 [phone] ? ?

## 2021-06-11 NOTE — ED Triage Notes (Signed)
Pt c/o ST x 2 days.  °

## 2021-06-11 NOTE — Discharge Instructions (Addendum)
Follow up with your primary care provider if your symptoms are not improving.     

## 2021-08-14 ENCOUNTER — Encounter: Payer: Self-pay | Admitting: Internal Medicine

## 2021-08-14 ENCOUNTER — Ambulatory Visit: Payer: 59 | Admitting: Internal Medicine

## 2021-08-14 VITALS — BP 122/62 | HR 99 | Temp 98.3°F | Resp 16 | Ht 65.25 in | Wt 240.6 lb

## 2021-08-14 DIAGNOSIS — R739 Hyperglycemia, unspecified: Secondary | ICD-10-CM | POA: Diagnosis not present

## 2021-08-14 DIAGNOSIS — R5383 Other fatigue: Secondary | ICD-10-CM | POA: Diagnosis not present

## 2021-08-14 DIAGNOSIS — A6000 Herpesviral infection of urogenital system, unspecified: Secondary | ICD-10-CM

## 2021-08-14 DIAGNOSIS — Z114 Encounter for screening for human immunodeficiency virus [HIV]: Secondary | ICD-10-CM

## 2021-08-14 DIAGNOSIS — Z23 Encounter for immunization: Secondary | ICD-10-CM

## 2021-08-14 DIAGNOSIS — Z13220 Encounter for screening for lipoid disorders: Secondary | ICD-10-CM

## 2021-08-14 DIAGNOSIS — M7989 Other specified soft tissue disorders: Secondary | ICD-10-CM

## 2021-08-14 DIAGNOSIS — N912 Amenorrhea, unspecified: Secondary | ICD-10-CM

## 2021-08-14 DIAGNOSIS — F331 Major depressive disorder, recurrent, moderate: Secondary | ICD-10-CM

## 2021-08-14 DIAGNOSIS — Z1159 Encounter for screening for other viral diseases: Secondary | ICD-10-CM

## 2021-08-14 LAB — POCT URINE PREGNANCY: Preg Test, Ur: NEGATIVE

## 2021-08-14 MED ORDER — SERTRALINE HCL 25 MG PO TABS: 25.0000 mg | ORAL_TABLET | Freq: Every day | ORAL | 3 refills | Status: DC

## 2021-08-14 MED ORDER — VALACYCLOVIR HCL 500 MG PO TABS: 500.0000 mg | ORAL_TABLET | Freq: Every day | ORAL | 1 refills | Status: DC

## 2021-08-14 NOTE — Progress Notes (Signed)
? ?New Patient Office Visit ? ?Subjective   ? ?Patient ID: April Buck, female    DOB: Aug 23, 1985  Age: 36 y.o. MRN: [id number] ? ?CC:  ?Chief Complaint  ?Patient presents with  ? Establish Care  ? Hyperglycemia  ?  Fx hx, feeling weird so has checked sugar some and running in 200's fasting  ? Amenorrhea  ?  Has fx hx early menopause and has hx of pcos  ? Edema  ?  Ankle w/ exercise/walking and hands  ? ? ?HPI ?April Buck presents to establish care.  ? ?Hx of genital HSV: ?-Currently on Valacyclovir 500 mg as needed - usually has flares about once a month before her.,  But she has not had a period since February which was when her last HSV flare was.  She is requesting refills today. ? ?Amenorrhea: ?-LMP 2/23; usually irregular in the past, not uncommon to skip every other month but has never gone this long without a period ?-Usually periods are associated with heavy bleeding, painful cramping  ?-Hx of multiple ovarian cysts burst, very painful  ?-Currently sexually active with husband ?-Hx of tubal ligation ?-Hx of PCOS - diagnosed by health department in 2013  ?-Used to be on oral birth control and had done well with it, last doctor wouldn't refill due to depression ?  Although she does have a history of migraines with aura ?-Family history of mother going through menopause in her 30s ? ?Hyperglycemia:  ?-Checking fasting blood sugar in the 200's ?-Family history of diabetes no personal history of diabetes that she knows of ?-Feeling very fatigued when this occurs ? ?MDD: ?-Mood status: exacerbated ?-Current treatment: None ?-Symptom severity: severe  ?Psychotherapy/counseling: no  ?Previous psychiatric medications: effexor and lexapro -tried both of these in the past, reports no side effects but felt like medication did not work ?Depressed mood: yes ?Anxious mood: yes ?Anhedonia: yes ?Significant weight loss or gain: no ?Insomnia: yes  ?Fatigue: yes ?Impaired concentration/indecisiveness: yes ?Suicidal  ideations: no ?Hopelessness: yes ?Crying spells:  No crying spells but feels very irritable and angry ? ?  08/14/2021  ?  2:14 PM  ?Depression screen PHQ 2/9  ?Decreased Interest 3  ?Down, Depressed, Hopeless 2  ?PHQ - 2 Score 5  ?Altered sleeping 3  ?Tired, decreased energy 3  ?Change in appetite 3  ?Feeling bad or failure about yourself  2  ?Trouble concentrating 3  ?Moving slowly or fidgety/restless 0  ?Suicidal thoughts 0  ?PHQ-9 Score 19  ?Difficult doing work/chores Somewhat difficult  ? ? ? ? ?Outpatient Encounter Medications as of 08/14/2021  ?Medication Sig  ? valACYclovir (VALTREX) 500 MG tablet Take 1 tablet (500 mg total) by mouth daily.  ? [DISCONTINUED] albuterol (VENTOLIN HFA) 108 (90 Base) MCG/ACT inhaler Inhale 1-2 puffs into the lungs every 6 (six) hours as needed for wheezing or shortness of breath.  ? [DISCONTINUED] azithromycin (ZITHROMAX) 250 MG tablet Take 1 tablet (250 mg total) by mouth daily. Take first 2 tablets together, then 1 every day until finished.  ? [DISCONTINUED] lidocaine (XYLOCAINE) 2 % solution Use as directed 15 mLs in the mouth or throat as needed for mouth pain.  ? [DISCONTINUED] norgestimate-ethinyl estradiol (SPRINTEC 28) 0.25-35 MG-MCG tablet Take 1 tablet by mouth daily.  ? [DISCONTINUED] predniSONE (STERAPRED UNI-PAK 21 TAB) 10 MG (21) TBPK tablet Take by mouth daily. As directed  ? ?No facility-administered encounter medications on file as of 08/14/2021.  ? ? ?Past Medical History:  ?Diagnosis Date  ?  Depression   ? Frequent headaches   ? GERD (gastroesophageal reflux disease)   ? Herpes   ? Migraines   ? PCOS (polycystic ovarian syndrome)   ? Seasonal allergies   ? ? ?Past Surgical History:  ?Procedure Laterality Date  ? CESAREAN SECTION    ? 2  ? CHOLECYSTECTOMY    ? TUBAL LIGATION    ? ? ?Family History  ?Problem Relation Age of Onset  ? Diabetes Mother   ? Hypertension Mother   ? Heart attack Maternal Aunt   ? Heart attack Maternal Grandmother   ? Diabetes Maternal  Grandmother   ? Breast cancer Paternal Grandmother   ? Diabetes Maternal Aunt   ? ? ?Social History  ? ?Socioeconomic History  ? Marital status: Legally Separated  ?  Spouse name: Not on file  ? Number of children: Not on file  ? Years of education: Not on file  ? Highest education level: Not on file  ?Occupational History  ? Not on file  ?Tobacco Use  ? Smoking status: Never  ? Smokeless tobacco: Never  ?Vaping Use  ? Vaping Use: Never used  ?Substance and Sexual Activity  ? Alcohol use: Yes  ?  Comment: rare  ? Drug use: No  ? Sexual activity: Yes  ?Other Topics Concern  ? Not on file  ?Social History Narrative  ? Married.  ? 3 children.   ? 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  ? Enjoys going to comedy clubs, riding horses, spending time with family.   ? ?Social Determinants of Health  ? ?Financial Resource Strain: Not on file  ?Food Insecurity: Not on file  ?Transportation Needs: Not on file  ?Physical Activity: Not on file  ?Stress: Not on file  ?Social Connections: Not on file  ?Intimate Partner Violence: Not on file  ? ? ?Review of Systems  ?Constitutional:  Positive for malaise/fatigue. Negative for chills and fever.  ?Respiratory:  Negative for cough.   ?Cardiovascular:  Positive for leg swelling. Negative for chest pain.  ?Gastrointestinal:  Negative for abdominal pain, nausea and vomiting.  ?Psychiatric/Behavioral:  Positive for depression.   ? ?  ? ? ?Objective   ? ?BP 122/62   Pulse 99   Temp 98.3 ?F (36.8 ?C)   Resp 16   Ht 5' 5.25" (1.657 m)   Wt 240 lb 9.6 oz (109.1 kg)   LMP 06/07/2021   SpO2 99%   BMI 39.73 kg/m?  ? ?Physical Exam ?Constitutional:   ?   Appearance: Normal appearance.  ?HENT:  ?   Head: Normocephalic and atraumatic.  ?   Mouth/Throat:  ?   Mouth: Mucous membranes are moist.  ?   Pharynx: Oropharynx is clear.  ?Eyes:  ?   Conjunctiva/sclera: Conjunctivae normal.  ?Cardiovascular:  ?   Rate and Rhythm: Normal rate and regular rhythm.  ?Pulmonary:  ?   Effort: Pulmonary  effort is normal.  ?   Breath sounds: Normal breath sounds.  ?Abdominal:  ?   General: There is no distension.  ?   Palpations: Abdomen is soft.  ?   Tenderness: There is no abdominal tenderness.  ?Musculoskeletal:     ?   General: Swelling present.  ?   Comments: Mild nonpitting edema in the bilateral lower extremities, right side mildly worse compared to left  ?Skin: ?   General: Skin is warm and dry.  ?Neurological:  ?   General: No focal deficit present.  ?  Mental Status: She is alert. Mental status is at baseline.  ?Psychiatric:     ?   Mood and Affect: Mood normal.     ?   Behavior: Behavior normal.  ? ? ? ?  ? ?Assessment & Plan:  ? ?1. Amenorrhea/Fatigue, unspecified type: History of tubal ligation, however we will obtain a urine pregnancy to be complete.  Does have a remote history of PCOS, will check testosterone, FSH/LH to rule out early menopause, prolactin, TSH and CBC to rule out anemia.  She had been on OCPs in the past, however she does have a history of migraine with aura and this was discontinued. ? ?- CBC w/Diff/Platelet ?- Testosterone ?- FSH/LH ?- Prolactin ?- POCT urine pregnancy ?- TSH ? ?2. Hyperglycemia: Feeling fatigued, would check blood sugar at home it would be in the 200s fasting.  She does have a family history of diabetes.  We will obtain a CMP to evaluate sugar and an A1c as well. ? ?- COMPLETE METABOLIC PANEL WITH GFR ?- HgB A1c ? ?3. Moderate episode of recurrent major depressive disorder [facility](HCC): Depression screening high today.  She has been trialed on Lexapro and Effexor in the past, she states that these medications do not cause side effects but just did not help her moods, depression and irritability.  She is willing to try a different medication.  We will try a low-dose of Zoloft, 25 mg daily sent to her pharmacy.  We discussed how she needs to take this medication consistently for at least 4 to 6 weeks to have any benefit.  She will follow-up in 6 weeks for recheck. ? ?-  sertraline (ZOLOFT) 25 MG tablet; Take 1 tablet (25 mg total) by mouth daily.  Dispense: 30 tablet; Refill: 3 ? ?4. Leg swelling: Nonpitting edema present in her bilateral lower extremities.  Discussed wearing compression

## 2021-08-14 NOTE — Patient Instructions (Addendum)
It was great seeing you today! ? ?Plan discussed at today's visit: ?-Blood work ordered today, results will be uploaded to MyChart.  ?-Valtrex refilled today ?-Start low dose Zoloft, will take 4-6 weeks of taking consistently before it will start working ? ?Follow up in: 6 weeks  ? ?Take care and let us know if you have any questions or concerns prior to your next visit. ? ?Dr. Caralee Ates ? ?

## 2021-08-15 ENCOUNTER — Encounter: Payer: Self-pay | Admitting: Internal Medicine

## 2021-08-15 DIAGNOSIS — N912 Amenorrhea, unspecified: Secondary | ICD-10-CM

## 2021-08-18 LAB — PROLACTIN: Prolactin: 8 ng/mL

## 2021-08-18 LAB — COMPLETE METABOLIC PANEL WITH GFR
AG Ratio: 1.7 (calc) (ref 1.0–2.5)
ALT: 67 U/L — ABNORMAL HIGH (ref 6–29)
AST: 36 U/L — ABNORMAL HIGH (ref 10–30)
Albumin: 4.4 g/dL (ref 3.6–5.1)
Alkaline phosphatase (APISO): 50 U/L (ref 31–125)
BUN: 11 mg/dL (ref 7–25)
CO2: 25 mmol/L (ref 20–32)
Calcium: 9.1 mg/dL (ref 8.6–10.2)
Chloride: 104 mmol/L (ref 98–110)
Creat: 0.63 mg/dL (ref 0.50–0.97)
Globulin: 2.6 g/dL (calc) (ref 1.9–3.7)
Glucose, Bld: 94 mg/dL (ref 65–99)
Potassium: 3.6 mmol/L (ref 3.5–5.3)
Sodium: 139 mmol/L (ref 135–146)
Total Bilirubin: 1.3 mg/dL — ABNORMAL HIGH (ref 0.2–1.2)
Total Protein: 7 g/dL (ref 6.1–8.1)
eGFR: 119 mL/min/{1.73_m2} (ref >=60)

## 2021-08-18 LAB — TESTOSTERONE, TOTAL, LC/MS/MS: Testosterone, Total, LC-MS-MS: 41 ng/dL (ref 2–45)

## 2021-08-18 LAB — CBC WITH DIFFERENTIAL/PLATELET
Absolute Monocytes: 773 cells/uL (ref 200–950)
Basophils Absolute: 8 cells/uL (ref 0–200)
Basophils Relative: 0.1 %
Eosinophils Absolute: 0 cells/uL — ABNORMAL LOW (ref 15–500)
Eosinophils Relative: 0 %
HCT: 41 % (ref 35.0–45.0)
Hemoglobin: 14 g/dL (ref 11.7–15.5)
Lymphs Abs: 2755 cells/uL (ref 850–3900)
MCH: 30 pg (ref 27.0–33.0)
MCHC: 34.1 g/dL (ref 32.0–36.0)
MCV: 87.8 fL (ref 80.0–100.0)
MPV: 10.4 fL (ref 7.5–12.5)
Monocytes Relative: 9.2 %
Neutro Abs: 4864 cells/uL (ref 1500–7800)
Neutrophils Relative %: 57.9 %
Platelets: 231 10*3/uL (ref 140–400)
RBC: 4.67 10*6/uL (ref 3.80–5.10)
RDW: 13.1 % (ref 11.0–15.0)
Total Lymphocyte: 32.8 %
WBC: 8.4 10*3/uL (ref 3.8–10.8)

## 2021-08-18 LAB — HEMOGLOBIN A1C
Hgb A1c MFr Bld: 5.5 % of total Hgb (ref <5.7)
Mean Plasma Glucose: 111 mg/dL
eAG (mmol/L): 6.2 mmol/L

## 2021-08-18 LAB — LIPID PANEL
Cholesterol: 158 mg/dL (ref <200)
HDL: 42 mg/dL — ABNORMAL LOW (ref >=50)
LDL Cholesterol (Calc): 82 mg/dL (calc)
Non-HDL Cholesterol (Calc): 116 mg/dL (calc) (ref <130)
Total CHOL/HDL Ratio: 3.8 (calc) (ref <5.0)
Triglycerides: 242 mg/dL — ABNORMAL HIGH (ref <150)

## 2021-08-18 LAB — HEPATITIS C ANTIBODY
Hepatitis C Ab: NONREACTIVE
SIGNAL TO CUT-OFF: 0.11 (ref <1.00)

## 2021-08-18 LAB — HIV ANTIBODY (ROUTINE TESTING W REFLEX): HIV 1&2 Ab, 4th Generation: NONREACTIVE

## 2021-08-18 LAB — FSH/LH
FSH: 4.1 m[IU]/mL
LH: 5.3 m[IU]/mL

## 2021-08-18 LAB — TSH: TSH: 3.52 mIU/L

## 2021-08-30 NOTE — Telephone Encounter (Signed)
Copied from CRM [id number]. Topic: Referral - Request for Referral >> Aug 30, 2021 10:00 AM Randol Kern wrote: Has patient seen PCP for this complaint? Yes *If NO, is insurance requiring patient see PCP for this issue before PCP can refer them? Referral for which specialty: Gynecology  Preferred provider/office: Highest recommended  Reason for referral: Per PCP/recent labs

## 2021-09-06 ENCOUNTER — Encounter: Payer: Self-pay | Admitting: Obstetrics

## 2021-09-06 ENCOUNTER — Ambulatory Visit: Payer: 59 | Admitting: Obstetrics

## 2021-09-06 ENCOUNTER — Other Ambulatory Visit: Payer: Self-pay | Admitting: Internal Medicine

## 2021-09-06 VITALS — BP 100/60 | Ht 65.0 in | Wt 230.0 lb

## 2021-09-06 DIAGNOSIS — N939 Abnormal uterine and vaginal bleeding, unspecified: Secondary | ICD-10-CM | POA: Diagnosis not present

## 2021-09-06 DIAGNOSIS — Z9889 Other specified postprocedural states: Secondary | ICD-10-CM

## 2021-09-06 DIAGNOSIS — Z809 Family history of malignant neoplasm, unspecified: Secondary | ICD-10-CM

## 2021-09-06 DIAGNOSIS — Z23 Encounter for immunization: Secondary | ICD-10-CM

## 2021-09-06 DIAGNOSIS — A6000 Herpesviral infection of urogenital system, unspecified: Secondary | ICD-10-CM

## 2021-09-06 DIAGNOSIS — F331 Major depressive disorder, recurrent, moderate: Secondary | ICD-10-CM

## 2021-09-06 NOTE — Progress Notes (Signed)
Chief Complaint  Patient presents with   Menstrual Problem    Pt is irregular with her cycles, recently cycles are more irregular than usual. Skipping 3-4 months now   Patient April Buck is an [id number] Patient's last menstrual period was 08/24/2021 (exact date). currently BTL for contraception who presents for the complaint of abnormal uterine bleeding. She has a prolonged history of oligomenorrhea. Recently the periods are getting more spaced and when they returned they are heavy. She is also having occasional right sided pain that comes on intense and then just goes away after a minute.  She complains of occ acne, reports hirsutism, and denies scalp hair loss. She denies voice or clitoral changes. Recent glucocorticoid use: no She denies vision changes, bleeding gums, or nose bleeds. She denies easy bleeding or bruising. There is no family history of blood disorders. She is sexually active with no sex drive. She is exercising regularly. She does perform self breast exam. Has noticed lumps under armpits that go away. She takes a multivitamin. She reports hot flash and night sweat vasomotor symptoms, has occ vaginal dryness. There can be other times she feels excessively wet.  There is family history of colon cancer. Mother died at 46. She had hyst at 46. She died of chronic kidney disease.   Ultrasound: none  Follows with primary care provider: Margarita Mail, DO  Review of Systems  Constitutional:  Positive for diaphoresis and fatigue. Negative for activity change, appetite change, chills, fever and unexpected weight change.  HENT:  Positive for congestion and sneezing. Negative for dental problem, drooling, ear discharge, ear pain, facial swelling, hearing loss, mouth sores, nosebleeds, postnasal drip, rhinorrhea, sinus pressure, sinus pain, sore throat, tinnitus, trouble swallowing and voice change.   Eyes:  Negative for photophobia, pain, discharge, redness, itching and visual  disturbance.  Respiratory:  Negative for apnea, cough, choking, chest tightness, shortness of breath, wheezing and stridor.   Cardiovascular:  Negative for chest pain, palpitations and leg swelling.  Gastrointestinal:  Negative for abdominal distention, abdominal pain, anal bleeding, blood in stool, constipation, diarrhea, nausea, rectal pain and vomiting.  Endocrine: Positive for heat intolerance. Negative for cold intolerance, polydipsia, polyphagia and polyuria.  Genitourinary:  Negative for decreased urine volume, difficulty urinating, dyspareunia, dysuria, enuresis, flank pain, frequency, genital sores, hematuria, menstrual problem, pelvic pain, urgency, vaginal bleeding, vaginal discharge and vaginal pain.       Low libido   Musculoskeletal:  Negative for arthralgias, back pain, gait problem, joint swelling, myalgias, neck pain and neck stiffness.  Skin:  Negative for color change, pallor, rash and wound.  Allergic/Immunologic: Negative for environmental allergies, food allergies and immunocompromised state.  Neurological:  Positive for headaches. Negative for dizziness, tremors, seizures, syncope, facial asymmetry, speech difficulty, weakness, light-headedness and numbness.  Hematological:  Negative for adenopathy. Does not bruise/bleed easily.  Psychiatric/Behavioral:  Positive for dysphoric mood. Negative for agitation, behavioral problems, confusion, decreased concentration, hallucinations, self-injury, sleep disturbance and suicidal ideas. The patient is nervous/anxious. The patient is not hyperactive.    Gynecologic History: Menarche: 11 Period Cycle (Days):  (irregular) Period Pattern: (!) Irregular Has been on sprintec in the past as well as mirena and depo. Was on sprintec 3 yrs ago for these symptoms.  Mirena and depo were used for the purpose of contraception Last menses prior to this was February Has gone as long as 3 mo without a cycle Cycle awareness is just  cramping History of abnormal paps s/p LEEP last pap about 3 yrs  ago normal History of HSV and will take valtrex when she gets a lesion which occurs with her menses  History of chlamydia She has been having boils down below  She denies domestic violence or sexual abuse. Patient did not receive Gardisil Health Maintenance  Topic Date Due   COVID-19 Vaccine (1) Never done   PAP SMEAR-Modifier  09/29/2018   INFLUENZA VACCINE  11/06/2021   TETANUS/TDAP  08/15/2031   Hepatitis C Screening  Completed   HIV Screening  Completed   HPV VACCINES  Aged Out   Past Medical History:  Diagnosis Date   Depression    Frequent headaches    GERD (gastroesophageal reflux disease)    Herpes    Migraines    PCOS (polycystic ovarian syndrome)    Seasonal allergies    Past Surgical History:  Procedure Laterality Date   CESAREAN SECTION     2   CHOLECYSTECTOMY     TUBAL LIGATION     Family History  Problem Relation Age of Onset   Diabetes Mother    Hypertension Mother    Heart attack Maternal Aunt    Diabetes Maternal Aunt    Heart attack Maternal Grandmother    Diabetes Maternal Grandmother    Skin cancer Maternal Grandfather    Breast cancer Paternal Grandmother        1860s   Rectal cancer Cousin    Social History   Socioeconomic History   Marital status: Legally Separated    Spouse name: Not on file   Number of children: Not on file   Years of education: Not on file   Highest education level: Not on file  Occupational History   Not on file  Tobacco Use   Smoking status: Never   Smokeless tobacco: Never  Vaping Use   Vaping Use: Never used  Substance and Sexual Activity   Alcohol use: Yes    Comment: rare   Drug use: No   Sexual activity: Yes    Birth control/protection: None  Other Topics Concern   Not on file  Social History Narrative   Married.   3 children.    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   Enjoys going to comedy clubs, riding horses, spending time with  family.    Social Determinants of Health   Financial Resource Strain: Not on file  Food Insecurity: Not on file  Transportation Needs: Not on file  Physical Activity: Not on file  Stress: Not on file  Social Connections: Not on file  Intimate Partner Violence: Not on file   Medicine list and allergies reviewed and updated.    Objective:  BP 100/60   Ht 5\' 5"  (1.651 m)   Wt 230 lb (104.3 kg)   LMP 08/24/2021 (Exact Date)   BMI 38.27 kg/m  Physical Exam Vitals and nursing note reviewed.  Constitutional:      Appearance: Normal appearance.  HENT:     Head: Normocephalic and atraumatic.  Eyes:     Extraocular Movements: Extraocular movements intact.  Pulmonary:     Effort: Pulmonary effort is normal.  Musculoskeletal:        General: Normal range of motion.     Cervical back: Normal range of motion.  Skin:    General: Skin is warm and dry.  Neurological:     General: No focal deficit present.     Mental Status: She is alert and oriented to person, place, and time.  Psychiatric:  Mood and Affect: Mood normal.        Behavior: Behavior normal.        Thought Content: Thought content normal.   Assessment:  Abnormal uterine bleeding (AUB) - Plan: 17-Hydroxyprogesterone, Sex hormone binding globulin, DHEA-sulfate, Insulin, random, US PELVIC COMPLETE WITH TRANSVAGINAL  Family history of cancer  History of loop electrical excision procedure (LEEP)  Need for HPV vaccination - Plan: HPV 9-valent vaccine,Recombinat  Plan: Physical exam findings of metabolic syndrome are/are not identified: Blood pressure, BMI, increased weight circumference. Probable diagnosis of PCOS discussed with patient. PCOS labs obtained as above We discussed weight loss of 5 - 10% of body weight can cause cycles to return to normal Patietn has been very motivated with diet and exercise and is down 10 lbs in 1 month. We will watch to see how this affects her cycles  Discussed options of  estrogen containing vs progesterone only contraceptive options - she noticed improvement with hirsutism with OCPs in past Will have patient return for [place] As appt scheduled for pap  Monthly SBE. Yearly MMG age 79  Recommend MVI. Daily folic acid, calcium and vitamin D requirements discussed with patient.  Contraception: BTL  Gardisil discussed and desires, # 1 today  Colonscopy age 73  Discussed risk assessment for hereditary cancer syndromes based on her scoring on the screening tool. She would like if covered.   Return for ultrasound AUB and follow up needs fasting labs .

## 2021-09-06 NOTE — Patient Instructions (Signed)
Have a great year! Please call with any concerns. Don't forget to wear your seatbelt everyday! If you are not signed up on MyChart, please ask [place] how to sign up for it!   In a world where you can be anything, please be kind.   Body mass index is 38.27 kg/m.  A Healthy Lifestyle: Care Instructions Your Care Instructions  A healthy lifestyle can help you feel good, stay at a healthy weight, and have plenty of energy for both work and play. A healthy lifestyle is something you can share with your whole family. A healthy lifestyle also can lower your risk for serious health problems, such as high blood pressure, heart disease, and diabetes. You can follow a few steps listed below to improve your health and the health of your family. Follow-up care is a key part of your treatment and safety. Be sure to make and go to all appointments, and call your doctor if you are having problems. It's also a good idea to know your test results and keep a list of the medicines you take. How can you care for yourself at home? Do not eat too much sugar, fat, or fast foods. You can still have dessert and treats now and then. The goal is moderation. Start small to improve your eating habits. Pay attention to portion sizes, drink less juice and soda pop, and eat more fruits and vegetables. Eat a healthy amount of food. A 3-ounce serving of meat, for example, is about the size of a deck of cards. Fill the rest of your plate with vegetables and whole grains. Limit the amount of soda and sports drinks you have every day. Drink more water when you are thirsty. Eat at least 5 servings of fruits and vegetables every day. It may seem like a lot, but it is not hard to reach this goal. A serving or helping is 1 piece of fruit, 1 cup of vegetables, or 2 cups of leafy, raw vegetables. Have an apple or some carrot sticks as an afternoon snack instead of a candy bar. Try to have fruits and/or vegetables at every meal. Make exercise  part of your daily routine. You may want to start with simple activities, such as walking, bicycling, or slow swimming. Try to be active 30 to 60 minutes every day. You do not need to do all 30 to 60 minutes all at once. For example, you can exercise 3 times a day for 10 or 20 minutes. Moderate exercise is safe for most people, but it is always a good idea to talk to your doctor before starting an exercise program. Keep moving. Mow the lawn, work in the garden, or [employer]. Take the stairs instead of the elevator at work. If you smoke, quit. People who smoke have an increased risk for heart attack, stroke, cancer, and other lung illnesses. Quitting is hard, but there are ways to boost your chance of quitting tobacco for good. Use nicotine gum, patches, or lozenges. Ask your doctor about stop-smoking programs and medicines. Keep trying. In addition to reducing your risk of diseases in the future, you will notice some benefits soon after you stop using tobacco. If you have shortness of breath or asthma symptoms, they will likely get better within a few weeks after you quit. Limit how much alcohol you drink. Moderate amounts of alcohol (up to 2 drinks a day for men, 1 drink a day for women) are okay. But drinking too much can lead to  liver problems, high blood pressure, and other health problems. Family health If you have a family, there are many things you can do together to improve your health. Eat meals together as a family as often as possible. Eat healthy foods. This includes fruits, vegetables, lean meats and dairy, and whole grains. Include your family in your fitness plan. Most people think of activities such as jogging or tennis as the way to fitness, but there are many ways you and your family can be more active. Anything that makes you breathe hard and gets your heart pumping is exercise. Here are some tips: Walk to do errands or to take your child to school or the bus. Go for a family  bike ride after dinner instead of watching TV. Care instructions adapted under license by your healthcare professional. This care instruction is for use with your licensed healthcare professional. If you have questions about a medical condition or this instruction, always ask your healthcare professional. [place] any warranty or liability for your use of this information.

## 2021-09-07 NOTE — Telephone Encounter (Signed)
Requested Prescriptions  Pending Prescriptions Disp Refills  . valACYclovir (VALTREX) 500 MG tablet [Pharmacy Med Name: VALACYCLOVIR HCL 500 MG TABLET] 30 tablet 1    Sig: TAKE 1 TABLET (500 MG TOTAL) BY MOUTH DAILY.     Antimicrobials:  Antiviral Agents - Anti-Herpetic Passed - 09/06/2021 11:32 AM      Passed - Valid encounter within last 12 months    Recent Outpatient Visits          3 weeks ago [facility] Teodora Medici, DO      Future Appointments            In 1 month Teodora Medici, [facility], [facility]

## 2021-09-07 NOTE — Telephone Encounter (Signed)
Requesting 90 day supply Requested Prescriptions  Pending Prescriptions Disp Refills  . sertraline (ZOLOFT) 25 MG tablet [Pharmacy Med Name: SERTRALINE HCL 25 MG TABLET] 90 tablet 2    Sig: TAKE 1 TABLET (25 MG TOTAL) BY MOUTH DAILY.     Psychiatry:  Antidepressants - SSRI - sertraline Failed - 09/06/2021  2:32 PM      Failed - AST in normal range and within 360 days    AST  Date Value Ref Range Status  08/14/2021 36 (H) 10 - 30 U/L Final   SGOT(AST)  Date Value Ref Range Status  06/07/2013 16 15 - 37 Unit/L Final         Failed - ALT in normal range and within 360 days    ALT  Date Value Ref Range Status  08/14/2021 67 (H) 6 - 29 U/L Final   SGPT (ALT)  Date Value Ref Range Status  06/07/2013 39 12 - 78 U/L Final         Passed - Completed PHQ-2 or PHQ-9 in the last 360 days      Passed - Valid encounter within last 6 months    Recent Outpatient Visits          3 weeks ago Amenorrhea   [facility] Margarita Mail, DO      Future Appointments            In 1 month Margarita Mail, DO [facility], [facility]

## 2021-09-11 NOTE — Progress Notes (Signed)
Pt is scheduled for genetic testing.

## 2021-09-14 ENCOUNTER — Other Ambulatory Visit: Payer: Self-pay

## 2021-09-14 MED ORDER — SERTRALINE HCL 25 MG PO TABS
ORAL_TABLET | ORAL | 0 refills | Status: DC
  Filled 2021-09-14: qty 90, 90d supply, fill #0

## 2021-09-27 ENCOUNTER — Other Ambulatory Visit: Payer: 59

## 2021-09-27 ENCOUNTER — Ambulatory Visit (INDEPENDENT_AMBULATORY_CARE_PROVIDER_SITE_OTHER): Payer: 59

## 2021-09-27 DIAGNOSIS — N939 Abnormal uterine and vaginal bleeding, unspecified: Secondary | ICD-10-CM

## 2021-09-30 LAB — 17-HYDROXYPROGESTERONE: 17-OH Progesterone LCMS: 244 ng/dL

## 2021-09-30 LAB — INSULIN, RANDOM: INSULIN: 29.3 u[IU]/mL — ABNORMAL HIGH (ref 2.6–24.9)

## 2021-09-30 LAB — DHEA-SULFATE: DHEA-SO4: 139 ug/dL (ref 57.3–279.2)

## 2021-09-30 LAB — SEX HORMONE BINDING GLOBULIN: Sex Hormone Binding: 38.6 nmol/L (ref 24.6–122.0)

## 2021-10-02 ENCOUNTER — Telehealth: Payer: Self-pay | Admitting: Obstetrics

## 2021-10-03 NOTE — Telephone Encounter (Signed)
Mychart msg sent regarding test results.

## 2021-10-06 ENCOUNTER — Encounter: Payer: Self-pay | Admitting: Internal Medicine

## 2021-10-11 ENCOUNTER — Encounter: Payer: 59 | Admitting: Internal Medicine

## 2021-10-11 NOTE — Progress Notes (Deleted)
Established Patient Office Visit  Subjective    Patient ID: April Buck, female    DOB: 11-30-1985  Age: 36 y.o. MRN: [id number]  CC:  No chief complaint on file.   HPI Tiffany R Costello presents for follow up on chronic medication conditions.  Hx of genital HSV: -Currently on Valacyclovir 500 mg as needed - usually has flares about once a month before her.,  But she has not had a period since February which was when her last HSV flare was.  She is requesting refills today.  Amenorrhea/PCOS: -Now following with Gynecology, note from 09/06/21 reviewed. Planning on [place], potentially OCP's.  -LMP 2/23; usually irregular in the past, not uncommon to skip every other month but has never gone this long without a period -Usually periods are associated with heavy bleeding, painful cramping  -Hx of multiple ovarian cysts burst, very painful  -Currently sexually active with husband -Hx of tubal ligation -Hx of PCOS - diagnosed by health department in 2013  -Used to be on oral birth control and had done well with it, last doctor wouldn't refill due to depression ?  Although she does have a history of migraines with aura -Family history of mother going through menopause in her 30s  Hyperglycemia:  -Checking fasting blood sugar in the 200's -Family history of diabetes no personal history of diabetes that she knows of -Feeling very fatigued when this occurs  MDD: -Mood status: exacerbated -Current treatment: Zoloft 25 mg added at LOV -Symptom severity: severe  Psychotherapy/counseling: no  Previous psychiatric medications: effexor and lexapro -tried both of these in the past, reports no side effects but felt like medication did not work Depressed mood: yes Anxious mood: yes Anhedonia: yes Significant weight loss or gain: no Insomnia: yes  Fatigue: yes Impaired concentration/indecisiveness: yes Suicidal ideations: no Hopelessness: yes Crying spells:  No crying spells but feels very  irritable and angry    09/06/2021    3:33 PM 08/14/2021    2:14 PM  Depression screen PHQ 2/9  Decreased Interest 0 3  Down, Depressed, Hopeless 0 2  PHQ - 2 Score 0 5  Altered sleeping 1 3  Tired, decreased energy 1 3  Change in appetite 0 3  Feeling bad or failure about yourself  0 2  Trouble concentrating 0 3  Moving slowly or fidgety/restless 0 0  Suicidal thoughts 0 0  PHQ-9 Score 2 19  Difficult doing work/chores Somewhat difficult Somewhat difficult   Health Maintenance: -Blood work UTD -   Outpatient Encounter Medications as of 10/11/2021  Medication Sig   sertraline (ZOLOFT) 25 MG tablet TAKE 1 TABLET (25 MG TOTAL) BY MOUTH DAILY.   sertraline (ZOLOFT) 25 MG tablet Take 1 tablet by mouth once daily   valACYclovir (VALTREX) 500 MG tablet TAKE 1 TABLET (500 MG TOTAL) BY MOUTH DAILY.   No facility-administered encounter medications on file as of 10/11/2021.    Past Medical History:  Diagnosis Date   Depression    Frequent headaches    GERD (gastroesophageal reflux disease)    Herpes    Migraines    PCOS (polycystic ovarian syndrome)    Seasonal allergies     Past Surgical History:  Procedure Laterality Date   CESAREAN SECTION     2   CHOLECYSTECTOMY     TUBAL LIGATION      Family History  Problem Relation Age of Onset   Diabetes Mother    Hypertension Mother    Heart attack Maternal  Aunt    Diabetes Maternal Aunt    Heart attack Maternal Grandmother    Diabetes Maternal Grandmother    Skin cancer Maternal Grandfather    Breast cancer Paternal Grandmother        70s   Rectal cancer Cousin     Social History   Socioeconomic History   Marital status: Legally Separated    Spouse name: Not on file   Number of children: Not on file   Years of education: Not on file   Highest education level: Not on file  Occupational History   Not on file  Tobacco Use   Smoking status: Never   Smokeless tobacco: Never  Vaping Use   Vaping Use: Never used   Substance and Sexual Activity   Alcohol use: Yes    Comment: rare   Drug use: No   Sexual activity: Yes    Birth control/protection: None  Other Topics Concern   Not on file  Social History Narrative   Married.   3 children.    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   Enjoys going to comedy clubs, riding horses, spending time with family.    Social Determinants of Health   Financial Resource Strain: Not on file  Food Insecurity: Not on file  Transportation Needs: Not on file  Physical Activity: Not on file  Stress: Not on file  Social Connections: Not on file  Intimate Partner Violence: Not on file    Review of Systems  Constitutional:  Positive for malaise/fatigue. Negative for chills and fever.  Respiratory:  Negative for cough.   Cardiovascular:  Positive for leg swelling. Negative for chest pain.  Gastrointestinal:  Negative for abdominal pain, nausea and vomiting.  Psychiatric/Behavioral:  Positive for depression.         Objective    There were no vitals taken for this visit.  Physical Exam Constitutional:      Appearance: Normal appearance.  HENT:     Head: Normocephalic and atraumatic.     Mouth/Throat:     Mouth: Mucous membranes are moist.     Pharynx: Oropharynx is clear.  Eyes:     Conjunctiva/sclera: Conjunctivae normal.  Cardiovascular:     Rate and Rhythm: Normal rate and regular rhythm.  Pulmonary:     Effort: Pulmonary effort is normal.     Breath sounds: Normal breath sounds.  Abdominal:     General: There is no distension.     Palpations: Abdomen is soft.     Tenderness: There is no abdominal tenderness.  Musculoskeletal:        General: Swelling present.     Comments: Mild nonpitting edema in the bilateral lower extremities, right side mildly worse compared to left  Skin:    General: Skin is warm and dry.  Neurological:     General: No focal deficit present.     Mental Status: She is alert. Mental status is at baseline.   Psychiatric:        Mood and Affect: Mood normal.        Behavior: Behavior normal.         Assessment & Plan:   1. Amenorrhea/Fatigue, unspecified type: History of tubal ligation, however we will obtain a urine pregnancy to be complete.  Does have a remote history of PCOS, will check testosterone, FSH/LH to rule out early menopause, prolactin, TSH and CBC to rule out anemia.  She had been on OCPs in the past, however she does  have a history of migraine with aura and this was discontinued.  - CBC w/Diff/Platelet - Testosterone - FSH/LH - Prolactin - POCT urine pregnancy - TSH  2. Hyperglycemia: Feeling fatigued, would check blood sugar at home it would be in the 200s fasting.  She does have a family history of diabetes.  We will obtain a CMP to evaluate sugar and an A1c as well.  - COMPLETE METABOLIC PANEL WITH GFR - HgB Z6X  3. Moderate episode of recurrent major depressive disorder [facility]): Depression screening high today.  She has been trialed on Lexapro and Effexor in the past, she states that these medications do not cause side effects but just did not help her moods, depression and irritability.  She is willing to try a different medication.  We will try a low-dose of Zoloft, 25 mg daily sent to her pharmacy.  We discussed how she needs to take this medication consistently for at least 4 to 6 weeks to have any benefit.  She will follow-up in 6 weeks for recheck.  - sertraline (ZOLOFT) 25 MG tablet; Take 1 tablet (25 mg total) by mouth daily.  Dispense: 30 tablet; Refill: 3  4. Leg swelling: Nonpitting edema present in her bilateral lower extremities.  Discussed wearing compression stockings at work and elevating her legs of the level of her heart while at home.  5. Lipid screening: Due for cholesterol screening today.  - Lipid Profile  5. Encounter for screening for HIV/Need for hepatitis C screening test: Due for both HIV and hepatitis C screening today.  - HIV antibody  (with reflex) - Hepatitis C Antibody  6. Recurrent genital herpes: Has not had a flare since February, however Valtrex 500 mg to take as needed was refilled today.  - valACYclovir (VALTREX) 500 MG tablet; Take 1 tablet (500 mg total) by mouth daily.  Dispense: 30 tablet; Refill: 1  7. Need for Tdap vaccination: Tdap vaccine administered today.  - Tdap vaccine greater than or equal to 7yo IM  No follow-ups on file.  In 6 weeks.  Margarita Mail, DO

## 2021-11-06 ENCOUNTER — Ambulatory Visit (INDEPENDENT_AMBULATORY_CARE_PROVIDER_SITE_OTHER): Payer: 59 | Admitting: Internal Medicine

## 2021-11-06 ENCOUNTER — Encounter: Payer: Self-pay | Admitting: Internal Medicine

## 2021-11-06 ENCOUNTER — Other Ambulatory Visit: Payer: Self-pay

## 2021-11-06 ENCOUNTER — Other Ambulatory Visit (HOSPITAL_COMMUNITY)
Admission: RE | Admit: 2021-11-06 | Discharge: 2021-11-06 | Disposition: A | Discharge status: Home / Self Care | Payer: 59 | Source: Ambulatory Visit | Attending: Internal Medicine | Admitting: Internal Medicine

## 2021-11-06 VITALS — BP 116/76 | HR 93 | Temp 98.1°F | Resp 16 | Ht 65.25 in | Wt 214.8 lb

## 2021-11-06 DIAGNOSIS — R7989 Other specified abnormal findings of blood chemistry: Secondary | ICD-10-CM | POA: Diagnosis not present

## 2021-11-06 DIAGNOSIS — Z0000 Encounter for general adult medical examination without abnormal findings: Secondary | ICD-10-CM | POA: Diagnosis not present

## 2021-11-06 DIAGNOSIS — N898 Other specified noninflammatory disorders of vagina: Secondary | ICD-10-CM | POA: Diagnosis not present

## 2021-11-06 DIAGNOSIS — Z124 Encounter for screening for malignant neoplasm of cervix: Secondary | ICD-10-CM

## 2021-11-06 DIAGNOSIS — N760 Acute vaginitis: Secondary | ICD-10-CM

## 2021-11-06 DIAGNOSIS — F331 Major depressive disorder, recurrent, moderate: Secondary | ICD-10-CM

## 2021-11-06 DIAGNOSIS — Z23 Encounter for immunization: Secondary | ICD-10-CM

## 2021-11-06 DIAGNOSIS — E282 Polycystic ovarian syndrome: Secondary | ICD-10-CM

## 2021-11-06 DIAGNOSIS — B9689 Other specified bacterial agents as the cause of diseases classified elsewhere: Secondary | ICD-10-CM

## 2021-11-06 MED ORDER — NORGESTIM-ETH ESTRAD TRIPHASIC 0.18/0.215/0.25 MG-25 MCG PO TABS
1.0000 | ORAL_TABLET | Freq: Every day | ORAL | 2 refills | Status: DC
  Filled 2021-11-06: qty 56, 56d supply, fill #0
  Filled 2021-11-06: qty 28, 28d supply, fill #0
  Filled 2021-12-07: qty 28, 28d supply, fill #1

## 2021-11-06 MED ORDER — SERTRALINE HCL 50 MG PO TABS
50.0000 mg | ORAL_TABLET | Freq: Every day | ORAL | 1 refills | Status: DC
  Filled 2021-11-06: qty 30, 30d supply, fill #0
  Filled 2021-12-07: qty 30, 30d supply, fill #1

## 2021-11-06 NOTE — Progress Notes (Signed)
Name: April Buck   MRN: [id number]    DOB: 10/27/1985   Date:11/06/2021       Progress Note  Subjective  Chief Complaint  Chief Complaint  Patient presents with   Annual Exam    W/ pap    HPI  Patient presents for annual CPE. Will also follow up on chronic medical conditions.   PCOS: -History of irregular periods and heavy bleeding with painful cramping  -Hx of multiple ovarian cysts burst, very painful  -Currently sexually active with husband -Hx of tubal ligation -Seen by Gynecology, note, labs and pelvic US reviewed from 09/06/21. Pelvic US showing ovarian cysts consistent with PCOS -Used to be on oral birth control and had done well with it. Has a remote history of migraines but none in the last 5 years -No issues with blood pressure, non-smoker -Family history of mother going through menopause in her 33s  Diet: cut out red meats and fried foods, doesn't eat out as much, drinks water. Lost about 30 pounds since LOV. Exercise:  HIIT 4x a week, walking    Depression: Phq 9 is  improved since starting Zoloft 25 mg in May. Denies side effects but would like to increase the dose today.     09/06/2021    3:33 PM 08/14/2021    2:14 PM  Depression screen PHQ 2/9  Decreased Interest 0 3  Down, Depressed, Hopeless 0 2  PHQ - 2 Score 0 5  Altered sleeping 1 3  Tired, decreased energy 1 3  Change in appetite 0 3  Feeling bad or failure about yourself  0 2  Trouble concentrating 0 3  Moving slowly or fidgety/restless 0 0  Suicidal thoughts 0 0  PHQ-9 Score 2 19  Difficult doing work/chores Somewhat difficult Somewhat difficult   Hypertension: BP Readings from Last 3 Encounters:  09/06/21 100/60  08/14/21 122/62  06/11/21 136/87   Obesity: Wt Readings from Last 3 Encounters:  11/06/21 214 lb 12.8 oz (97.4 kg)  09/06/21 230 lb (104.3 kg)  08/14/21 240 lb 9.6 oz (109.1 kg)   BMI Readings from Last 3 Encounters:  11/06/21 35.47 kg/m  09/06/21 38.27 kg/m  08/14/21  39.73 kg/m     Vaccines:   HPV: First dose 09/06/21, due for second today Tdap: 5/23 Shingrix: n/a Pneumonia: n/a Flu: yearly  COVID-19: 2 vaccines, 1 booster    Hep C Screening: negative 08/14/21 STD testing and prevention (HIV/chl/gon/syphilis): no concerns Intimate partner violence: negative screen  Menstrual History/LMP/Abnormal Bleeding: discussed above Discussed importance of follow up if any post-menopausal bleeding: not applicable  Incontinence Symptoms: negative for symptoms   Breast cancer:  - Last Mammogram: N/a - BRCA gene screening: n/a  Osteoporosis Prevention : Discussed high calcium and vitamin D supplementation, weight bearing exercises Bone density :not applicable   Cervical cancer screening: Due, last was normal, had a history of high grade previously   Skin cancer: Discussed monitoring for atypical lesions  Colorectal cancer: N/a   Lung cancer:  Low Dose CT Chest recommended if Age 75-80 years, 20 pack-year currently smoking OR have quit w/in 15years. Patient does not qualify for screen   ECG: 07/06/2015   Advanced Care Planning: A voluntary discussion about advance care planning including the explanation and discussion of advance directives.  Discussed health care proxy and Living will, and the patient was able to identify a health care proxy as husband Shandale Malak.  Patient does not have a living will and power of attorney of  health care   Lipids: Lab Results  Component Value Date   CHOL 158 08/14/2021   CHOL 147 06/08/2015   Lab Results  Component Value Date   HDL 42 (L) 08/14/2021   HDL 45.10 06/08/2015   Lab Results  Component Value Date   LDLCALC 82 08/14/2021   LDLCALC 71 06/08/2015   Lab Results  Component Value Date   TRIG 242 (H) 08/14/2021   TRIG 151.0 (H) 06/08/2015   Lab Results  Component Value Date   CHOLHDL 3.8 08/14/2021   CHOLHDL 3 06/08/2015   No results found for: "LDLDIRECT"  Glucose: Glucose  Date Value Ref  Range Status  03/05/2014 117 (H) 65 - 99 mg/dL Final  05/18/2013 118 (H) 65 - 99 mg/dL Final   Glucose, Bld  Date Value Ref Range Status  08/14/2021 94 65 - 99 mg/dL Final    Comment:    .            Fasting reference interval .   07/03/2015 115 (H) 65 - 99 mg/dL Final  06/08/2015 88 70 - 99 mg/dL Final    Patient Active Problem List   Diagnosis Date Noted   Preventative health care 09/29/2015   Obesity (BMI 35.0-39.9 without comorbidity) 08/01/2015   Palpitations 06/08/2015   Irregular periods 06/08/2015   Generalized anxiety disorder 06/08/2015    Past Surgical History:  Procedure Laterality Date   CESAREAN SECTION     2   CHOLECYSTECTOMY     TUBAL LIGATION      Family History  Problem Relation Age of Onset   Diabetes Mother    Hypertension Mother    Heart attack Maternal Aunt    Diabetes Maternal Aunt    Heart attack Maternal Grandmother    Diabetes Maternal Grandmother    Skin cancer Maternal Grandfather    Breast cancer Paternal Grandmother        53s   Rectal cancer Cousin     Social History   Socioeconomic History   Marital status: Legally Separated    Spouse name: Not on file   Number of children: Not on file   Years of education: Not on file   Highest education level: Not on file  Occupational History   Not on file  Tobacco Use   Smoking status: Never   Smokeless tobacco: Never  Vaping Use   Vaping Use: Never used  Substance and Sexual Activity   Alcohol use: Yes    Comment: rare   Drug use: No   Sexual activity: Yes    Birth control/protection: None  Other Topics Concern   Not on file  Social History Narrative   Married.   3 children.    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   Enjoys going to comedy clubs, riding horses, spending time with family.    Social Determinants of Health   Financial Resource Strain: Not on file  Food Insecurity: Not on file  Transportation Needs: Not on file  Physical Activity: Not on file  Stress:  Not on file  Social Connections: Not on file  Intimate Partner Violence: Not on file     Current Outpatient Medications:    sertraline (ZOLOFT) 25 MG tablet, TAKE 1 TABLET (25 MG TOTAL) BY MOUTH DAILY., Disp: 90 tablet, Rfl: 0   sertraline (ZOLOFT) 25 MG tablet, Take 1 tablet by mouth once daily, Disp: 90 tablet, Rfl: 0   valACYclovir (VALTREX) 500 MG tablet, TAKE 1 TABLET (500 MG TOTAL)  BY MOUTH DAILY., Disp: 30 tablet, Rfl: 1  Allergies  Allergen Reactions   Latex Hives and Swelling     Review of Systems  All other systems reviewed and are negative.   Objective  Vitals:   11/06/21 1350  BP: 116/76  Pulse: 93  Resp: 16  Temp: 98.1 F (36.7 C)  SpO2: 99%  Weight: 214 lb 12.8 oz (97.4 kg)  Height: 5' 5.25" (1.657 m)    Body mass index is 37.98 kg/m.  Physical Exam Exam conducted with a chaperone present.  Constitutional:      Appearance: Normal appearance.  HENT:     Head: Normocephalic and atraumatic.  Eyes:     Conjunctiva/sclera: Conjunctivae normal.  Cardiovascular:     Rate and Rhythm: Normal rate and regular rhythm.  Pulmonary:     Effort: Pulmonary effort is normal.     Breath sounds: Normal breath sounds.  Chest:     Chest wall: No mass, lacerations, deformity, swelling, tenderness, crepitus or edema. There is no dullness to percussion.  Breasts:    Right: Normal.     Left: Normal.  Genitourinary:    Comments: External genitalia within normal limits.  Vaginal mucosa pink, moist, normal rugae.  Nonfriable cervix without lesions, no discharge or bleeding noted on speculum exam.  Clear vaginal discharge present.  Musculoskeletal:     Right lower leg: No edema.     Left lower leg: No edema.  Lymphadenopathy:     Upper Body:     Right upper body: No supraclavicular, axillary or pectoral adenopathy.     Left upper body: No supraclavicular, axillary or pectoral adenopathy.  Skin:    General: Skin is warm and dry.  Neurological:     General: No focal  deficit present.     Mental Status: She is alert. Mental status is at baseline.  Psychiatric:        Mood and Affect: Mood normal.        Behavior: Behavior normal.     Recent Results (from the past 2160 hour(s))  POCT urine pregnancy     Status: Normal   Collection Time: 08/14/21  3:10 PM  Result Value Ref Range   Preg Test, Ur Negative Negative  CBC w/Diff/Platelet     Status: Abnormal   Collection Time: 08/14/21  3:25 PM  Result Value Ref Range   WBC 8.4 3.8 - 10.8 Thousand/uL   RBC 4.67 3.80 - 5.10 Million/uL   Hemoglobin 14.0 11.7 - 15.5 g/dL   HCT 41.0 35.0 - 45.0 %   MCV 87.8 80.0 - 100.0 fL   MCH 30.0 27.0 - 33.0 pg   MCHC 34.1 32.0 - 36.0 g/dL   RDW 13.1 11.0 - 15.0 %   Platelets 231 140 - 400 Thousand/uL   MPV 10.4 7.5 - 12.5 fL   Neutro Abs 4,864 1,500 - 7,800 cells/uL   Lymphs Abs 2,755 850 - 3,900 cells/uL   Absolute Monocytes 773 200 - 950 cells/uL   Eosinophils Absolute 0 (L) 15 - 500 cells/uL   Basophils Absolute 8 0 - 200 cells/uL   Neutrophils Relative % 57.9 %   Total Lymphocyte 32.8 %   Monocytes Relative 9.2 %   Eosinophils Relative 0.0 %   Basophils Relative 0.1 %  COMPLETE METABOLIC PANEL WITH GFR     Status: Abnormal   Collection Time: 08/14/21  3:25 PM  Result Value Ref Range   Glucose, Bld 94 65 - 99 mg/dL  Comment: .            Fasting reference interval .    BUN 11 7 - 25 mg/dL   Creat 0.63 0.50 - 0.97 mg/dL   eGFR 119 > OR = 60 mL/min/1.31m    Comment: The eGFR is based on the CKD-EPI 2021 equation. To calculate  the new eGFR from a previous Creatinine or Cystatin C result, go to https://www.kidney.org/professionals/ kdoqi/gfr%5Fcalculator    BUN/Creatinine Ratio NOT APPLICABLE 6 - 22 (calc)   Sodium 139 135 - 146 mmol/L   Potassium 3.6 3.5 - 5.3 mmol/L   Chloride 104 98 - 110 mmol/L   CO2 25 20 - 32 mmol/L   Calcium 9.1 8.6 - 10.2 mg/dL   Total Protein 7.0 6.1 - 8.1 g/dL   Albumin 4.4 3.6 - 5.1 g/dL   Globulin 2.6 1.9 - 3.7  g/dL (calc)   AG Ratio 1.7 1.0 - 2.5 (calc)   Total Bilirubin 1.3 (H) 0.2 - 1.2 mg/dL   Alkaline phosphatase (APISO) 50 31 - 125 U/L   AST 36 (H) 10 - 30 U/L   ALT 67 (H) 6 - 29 U/L  HgB A1c     Status: None   Collection Time: 08/14/21  3:25 PM  Result Value Ref Range   Hgb A1c MFr Bld 5.5 <5.7 % of total Hgb    Comment: For the purpose of screening for the presence of diabetes: . <5.7%       Consistent with the absence of diabetes 5.7-6.4%    Consistent with increased risk for diabetes             (prediabetes) > or =6.5%  Consistent with diabetes . This assay result is consistent with a decreased risk of diabetes. . Currently, no consensus exists regarding use of hemoglobin A1c for diagnosis of diabetes in children. . According to American Diabetes Association (ADA) guidelines, hemoglobin A1c <7.0% represents optimal control in non-pregnant diabetic patients. Different metrics may apply to specific patient populations.  Standards of Medical Care in Diabetes(ADA). .    Mean Plasma Glucose 111 mg/dL   eAG (mmol/L) 6.2 mmol/L  Lipid Profile     Status: Abnormal   Collection Time: 08/14/21  3:25 PM  Result Value Ref Range   Cholesterol 158 <200 mg/dL   HDL 42 (L) > OR = 50 mg/dL   Triglycerides 242 (H) <150 mg/dL    Comment: . If a non-fasting specimen was collected, consider repeat triglyceride testing on a fasting specimen if clinically indicated.  JYates Decampet al. J. of Clin. Lipidol. 22694;8:[phone] .Marland Kitchen   LDL Cholesterol (Calc) 82 mg/dL (calc)    Comment: Reference range: <100 . Desirable range <100 mg/dL for primary prevention;   <70 mg/dL for patients with CHD or diabetic patients  with > or = 2 CHD risk factors. .Marland KitchenLDL-C is now calculated using the Martin-Hopkins  calculation, which is a validated novel method providing  better accuracy than the Friedewald equation in the  estimation of LDL-C.  MCresenciano Genreet al. JAnnamaria Helling 23500;938(18: [phone]   (http://education.[facility].com/faq/FAQ164)    Total CHOL/HDL Ratio 3.8 <5.0 (calc)   Non-HDL Cholesterol (Calc) 116 <130 mg/dL (calc)    Comment: For patients with diabetes plus 1 major ASCVD risk  factor, treating to a non-HDL-C goal of <100 mg/dL  (LDL-C of <70 mg/dL) is considered a therapeutic  option.   FSH/LH     Status: None   Collection Time: 08/14/21  3:25 PM  Result  Value Ref Range   FSH 4.1 mIU/mL    Comment:                     Reference Range .              Follicular Phase       6.9-48.5              Mid-cycle Peak         3.1-17.7              Luteal Phase           1.5- 9.1              Postmenopausal       23.0-116.3              .    LH 5.3 mIU/mL    Comment:     Reference Range Follicular Phase  4.6-27.0 Mid-Cycle Peak    8.7-76.3 Luteal Phase      0.5-16.9 Postmenopausal    10.0-54.7   Prolactin     Status: None   Collection Time: 08/14/21  3:25 PM  Result Value Ref Range   Prolactin 8.0 ng/mL    Comment:             Reference Range  Females         Non-pregnant        3.0-30.0         Pregnant           10.0-209.0         Postmenopausal      2.0-20.0 . . .   TSH     Status: None   Collection Time: 08/14/21  3:25 PM  Result Value Ref Range   TSH 3.52 mIU/L    Comment:           Reference Range .           > or = 20 Years  0.40-4.50 .                Pregnancy Ranges           First trimester    0.26-2.66           Second trimester   0.55-2.73           Third trimester    0.43-2.91   HIV antibody (with reflex)     Status: None   Collection Time: 08/14/21  3:25 PM  Result Value Ref Range   HIV 1&2 Ab, 4th Generation NON-REACTIVE NON-REACTIVE    Comment: HIV-1 antigen and HIV-1/HIV-2 antibodies were not detected. There is no laboratory evidence of HIV infection. Marland Kitchen PLEASE NOTE: This information has been disclosed to you from records whose confidentiality may be protected by state law.  If your state requires such protection,  then the state law prohibits you from making any further disclosure of the information without the specific written consent of the person to whom it pertains, or as otherwise permitted by law. A general authorization for the release of medical or other information is NOT sufficient for this purpose. . For additional information please refer to http://education.[facility].com/faq/FAQ106 (This link is being provided for informational/ educational purposes only.) . Marland Kitchen The performance of this assay has not been clinically validated in patients less than 47 years old. .   Hepatitis C Antibody     Status: None   Collection Time: 08/14/21  3:25 PM  Result  Value Ref Range   Hepatitis C Ab NON-REACTIVE NON-REACTIVE   SIGNAL TO CUT-OFF 0.11 <1.00    Comment: . HCV antibody was non-reactive. There is no laboratory  evidence of HCV infection. . In most cases, no further action is required. However, if recent HCV exposure is suspected, a test for HCV RNA (test code [id number]) is suggested. . For additional information please refer to http://education.[facility].com/faq/FAQ22v1 (This link is being provided for informational/ educational purposes only.) .   Testosterone, Total, LC/MS/MS     Status: None   Collection Time: 08/14/21  3:25 PM  Result Value Ref Range   Testosterone, Total, LC-MS-MS 41 2 - 45 ng/dL    Comment: . For additional information, please refer to https://education.[facility].com/faq/TotalTestosteroneLCMSMS  (This link is being provided for informational/educational purposes only.) (Note) . This test was developed and its analytical performance characteristics have been determined by [facility]. It has not been cleared or approved by the FDA. This assay has been validated pursuant to the CLIA regulations and is used for clinical purposes. . . [facility] [address] [address] [phone] Willette Brace,  MD   17-Hydroxyprogesterone     Status: None   Collection Time: 09/27/21  8:51 AM  Result Value Ref Range   17-OH Progesterone LCMS 244 ng/dL    Comment:                           Adult Female                            Follicular        15 -  70                            Luteal            35 - 290   Sex hormone binding globulin     Status: None   Collection Time: 09/27/21  8:51 AM  Result Value Ref Range   Sex Hormone Binding 38.6 24.6 - 122.0 nmol/L  DHEA-sulfate     Status: None   Collection Time: 09/27/21  8:51 AM  Result Value Ref Range   DHEA-SO4 139.0 57.3 - 279.2 ug/dL  Insulin, random     Status: Abnormal   Collection Time: 09/27/21  8:51 AM  Result Value Ref Range   INSULIN 29.3 (H) 2.6 - 24.9 uIU/mL     Fall Risk:    08/14/2021    2:14 PM  Fall Risk   Falls in the past year? 0  Number falls in past yr: 0  Injury with Fall? 0   Assessment & Plan  1. Annual visit for general adult medical examination without abnormal findings/Screening for cervical cancer/ Clear vaginal discharge: Pap today. Cervical swab as well to rule out yeast, BV due to increased vaginal discharge per patient.  - Cytology - PAP  2. Moderate episode of recurrent major depressive disorder [facility]): Moods much better, doing well on Zoloft but does feel like increasing the dose will add benefit. Increase to 50 mg daily. Recheck in 6 weeks.   - sertraline (ZOLOFT) 50 MG tablet; Take 1 tablet (50 mg total) by mouth daily.  Dispense: 30 tablet; Refill: 1  3. PCOS (polycystic ovarian syndrome): Reviewed labs, pelvic US and Gynecology notes from 09/06/21. Patient would like to go back on hormonal  therapy. Discussed risk factors and potential side effects. Start low dose OPCs and recheck BP in 6 weeks.   - Norgestimate-Ethinyl Estradiol Triphasic (TRI-LO-SPRINTEC) 0.18/0.215/0.25 MG-25 MCG tab; Take 1 tablet by mouth daily.  Dispense: 28 tablet; Refill: 2  4. Elevated LFTs: On labs from May, recheck  today.   - COMPLETE METABOLIC PANEL WITH GFR  5. Need for HPV vaccine: Second HPV dose today, plan for third dose in 4 months.   - HPV 9-valent vaccine,Recombinat  Follow up in 6 weeks   -USPSTF grade A and B recommendations reviewed with patient; age-appropriate recommendations, preventive care, screening tests, etc discussed and encouraged; healthy living encouraged; see AVS for patient education given to patient -Discussed importance of 150 minutes of physical activity weekly, eat two servings of fish weekly, eat one serving of tree nuts ( cashews, pistachios, pecans, almonds.Marland Kitchen) every other day, eat 6 servings of fruit/vegetables daily and drink plenty of water and avoid sweet beverages.   -Reviewed Health Maintenance: Yes.

## 2021-11-06 NOTE — Patient Instructions (Signed)
It was great seeing you today!  Plan discussed at today's visit: -Blood work ordered today, results will be uploaded to MyChart.  -Start birth control today -Zoloft increased to 50 mg daily -Second HPV vaccine today, next one in about 4 months   Follow up in: 6 weeks   Take care and let us know if you have any questions or concerns prior to your next visit.  Dr. Caralee Ates

## 2021-11-06 NOTE — Progress Notes (Deleted)
Established Patient Office Visit  Subjective    Patient ID: April Buck, female    DOB: [birth year]  Age: 36 y.o. MRN: [id number]  CC:  No chief complaint on file.   HPI Sharmin R Massaro presents for follow up on chronic medication conditions.  Hx of genital HSV: -Currently on Valacyclovir 500 mg as needed - usually has flares about once a month before her.,  But she has not had a period since February which was when her last HSV flare was.  She is requesting refills today.  Amenorrhea/PCOS: -Now following with Gynecology, note from 09/06/21 reviewed. Planning on [place], potentially OCP's.  -LMP 2/23; usually irregular in the past, not uncommon to skip every other month but has never gone this long without a period -Usually periods are associated with heavy bleeding, painful cramping  -Hx of multiple ovarian cysts burst, very painful  -Currently sexually active with husband -Hx of tubal ligation -Hx of PCOS - diagnosed by health department in 2013  -Used to be on oral birth control and had done well with it, last doctor wouldn't refill due to depression ?  Although she does have a history of migraines with aura -Family history of mother going through menopause in her 48s -Following with Gynecology, note and labs reviewed from 09/06/21  Hyperglycemia:  -Checking fasting blood sugar in the 200's -Family history of diabetes no personal history of diabetes that she knows of -Feeling very fatigued when this occurs  MDD: -Mood status: exacerbated -Current treatment: Zoloft 25 mg added at LOV -Symptom severity: severe  Psychotherapy/counseling: no  Previous psychiatric medications: effexor and lexapro -tried both of these in the past, reports no side effects but felt like medication did not work Depressed mood: yes Anxious mood: yes Anhedonia: yes Significant weight loss or gain: no Insomnia: yes  Fatigue: yes Impaired concentration/indecisiveness: yes Suicidal ideations:  no Hopelessness: yes Crying spells:  No crying spells but feels very irritable and angry    09/06/2021    3:33 PM 08/14/2021    2:14 PM  Depression screen PHQ 2/9  Decreased Interest 0 3  Down, Depressed, Hopeless 0 2  PHQ - 2 Score 0 5  Altered sleeping 1 3  Tired, decreased energy 1 3  Change in appetite 0 3  Feeling bad or failure about yourself  0 2  Trouble concentrating 0 3  Moving slowly or fidgety/restless 0 0  Suicidal thoughts 0 0  PHQ-9 Score 2 19  Difficult doing work/chores Somewhat difficult Somewhat difficult   Health Maintenance: -Blood work UTD -   Outpatient Encounter Medications as of 11/06/2021  Medication Sig   sertraline (ZOLOFT) 25 MG tablet TAKE 1 TABLET (25 MG TOTAL) BY MOUTH DAILY.   sertraline (ZOLOFT) 25 MG tablet Take 1 tablet by mouth once daily   valACYclovir (VALTREX) 500 MG tablet TAKE 1 TABLET (500 MG TOTAL) BY MOUTH DAILY.   No facility-administered encounter medications on file as of 11/06/2021.    Past Medical History:  Diagnosis Date   Depression    Frequent headaches    GERD (gastroesophageal reflux disease)    Herpes    Migraines    PCOS (polycystic ovarian syndrome)    Seasonal allergies     Past Surgical History:  Procedure Laterality Date   CESAREAN SECTION     2   CHOLECYSTECTOMY     TUBAL LIGATION      Family History  Problem Relation Age of Onset   Diabetes Mother  Hypertension Mother    Heart attack Maternal Aunt    Diabetes Maternal Aunt    Heart attack Maternal Grandmother    Diabetes Maternal Grandmother    Skin cancer Maternal Grandfather    Breast cancer Paternal Grandmother        85s   Rectal cancer Cousin     Social History   Socioeconomic History   Marital status: Legally Separated    Spouse name: Not on file   Number of children: Not on file   Years of education: Not on file   Highest education level: Not on file  Occupational History   Not on file  Tobacco Use   Smoking status: Never    Smokeless tobacco: Never  Vaping Use   Vaping Use: Never used  Substance and Sexual Activity   Alcohol use: Yes    Comment: rare   Drug use: No   Sexual activity: Yes    Birth control/protection: None  Other Topics Concern   Not on file  Social History Narrative   Married.   3 children.    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   Enjoys going to comedy clubs, riding horses, spending time with family.    Social Determinants of Health   Financial Resource Strain: Not on file  Food Insecurity: Not on file  Transportation Needs: Not on file  Physical Activity: Not on file  Stress: Not on file  Social Connections: Not on file  Intimate Partner Violence: Not on file    Review of Systems  Constitutional:  Positive for malaise/fatigue. Negative for chills and fever.  Respiratory:  Negative for cough.   Cardiovascular:  Positive for leg swelling. Negative for chest pain.  Gastrointestinal:  Negative for abdominal pain, nausea and vomiting.  Psychiatric/Behavioral:  Positive for depression.         Objective    There were no vitals taken for this visit.  Physical Exam Constitutional:      Appearance: Normal appearance.  HENT:     Head: Normocephalic and atraumatic.     Mouth/Throat:     Mouth: Mucous membranes are moist.     Pharynx: Oropharynx is clear.  Eyes:     Conjunctiva/sclera: Conjunctivae normal.  Cardiovascular:     Rate and Rhythm: Normal rate and regular rhythm.  Pulmonary:     Effort: Pulmonary effort is normal.     Breath sounds: Normal breath sounds.  Abdominal:     General: There is no distension.     Palpations: Abdomen is soft.     Tenderness: There is no abdominal tenderness.  Musculoskeletal:        General: Swelling present.     Comments: Mild nonpitting edema in the bilateral lower extremities, right side mildly worse compared to left  Skin:    General: Skin is warm and dry.  Neurological:     General: No focal deficit present.      Mental Status: She is alert. Mental status is at baseline.  Psychiatric:        Mood and Affect: Mood normal.        Behavior: Behavior normal.         Assessment & Plan:   1. Amenorrhea/Fatigue, unspecified type: History of tubal ligation, however we will obtain a urine pregnancy to be complete.  Does have a remote history of PCOS, will check testosterone, FSH/LH to rule out early menopause, prolactin, TSH and CBC to rule out anemia.  She had been  on OCPs in the past, however she does have a history of migraine with aura and this was discontinued.  - CBC w/Diff/Platelet - Testosterone - FSH/LH - Prolactin - POCT urine pregnancy - TSH  2. Hyperglycemia: Feeling fatigued, would check blood sugar at home it would be in the 200s fasting.  She does have a family history of diabetes.  We will obtain a CMP to evaluate sugar and an A1c as well.  - COMPLETE METABOLIC PANEL WITH GFR - HgB Q7Y  3. Moderate episode of recurrent major depressive disorder [facility]): Depression screening high today.  She has been trialed on Lexapro and Effexor in the past, she states that these medications do not cause side effects but just did not help her moods, depression and irritability.  She is willing to try a different medication.  We will try a low-dose of Zoloft, 25 mg daily sent to her pharmacy.  We discussed how she needs to take this medication consistently for at least 4 to 6 weeks to have any benefit.  She will follow-up in 6 weeks for recheck.  - sertraline (ZOLOFT) 25 MG tablet; Take 1 tablet (25 mg total) by mouth daily.  Dispense: 30 tablet; Refill: 3  4. Leg swelling: Nonpitting edema present in her bilateral lower extremities.  Discussed wearing compression stockings at work and elevating her legs of the level of her heart while at home.  5. Lipid screening: Due for cholesterol screening today.  - Lipid Profile  5. Encounter for screening for HIV/Need for hepatitis C screening test: Due for  both HIV and hepatitis C screening today.  - HIV antibody (with reflex) - Hepatitis C Antibody  6. Recurrent genital herpes: Has not had a flare since February, however Valtrex 500 mg to take as needed was refilled today.  - valACYclovir (VALTREX) 500 MG tablet; Take 1 tablet (500 mg total) by mouth daily.  Dispense: 30 tablet; Refill: 1  7. Need for Tdap vaccination: Tdap vaccine administered today.  - Tdap vaccine greater than or equal to 7yo IM  No follow-ups on file.  In 6 weeks.  Margarita Mail, DO

## 2021-11-07 ENCOUNTER — Other Ambulatory Visit: Payer: Self-pay

## 2021-11-07 LAB — COMPLETE METABOLIC PANEL WITH GFR
AG Ratio: 1.8 (calc) (ref 1.0–2.5)
ALT: 26 U/L (ref 6–29)
AST: 18 U/L (ref 10–30)
Albumin: 4.3 g/dL (ref 3.6–5.1)
Alkaline phosphatase (APISO): 48 U/L (ref 31–125)
BUN/Creatinine Ratio: 9 (calc) (ref 6–22)
BUN: 6 mg/dL — ABNORMAL LOW (ref 7–25)
CO2: 24 mmol/L (ref 20–32)
Calcium: 9.3 mg/dL (ref 8.6–10.2)
Chloride: 105 mmol/L (ref 98–110)
Creat: 0.65 mg/dL (ref 0.50–0.97)
Globulin: 2.4 g/dL (calc) (ref 1.9–3.7)
Glucose, Bld: 93 mg/dL (ref 65–99)
Potassium: 3.8 mmol/L (ref 3.5–5.3)
Sodium: 137 mmol/L (ref 135–146)
Total Bilirubin: 0.6 mg/dL (ref 0.2–1.2)
Total Protein: 6.7 g/dL (ref 6.1–8.1)
eGFR: 118 mL/min/{1.73_m2} (ref >=60)

## 2021-11-08 ENCOUNTER — Other Ambulatory Visit: Payer: Self-pay

## 2021-11-08 LAB — CERVICOVAGINAL ANCILLARY ONLY
Bacterial Vaginitis (gardnerella): POSITIVE — AB
Candida Glabrata: NEGATIVE
Candida Vaginitis: NEGATIVE
Chlamydia: NEGATIVE
Comment: NEGATIVE
Comment: NEGATIVE
Comment: NEGATIVE
Comment: NEGATIVE
Comment: NEGATIVE
Comment: NORMAL
Neisseria Gonorrhea: NEGATIVE
Trichomonas: NEGATIVE

## 2021-11-08 MED ORDER — METRONIDAZOLE 500 MG PO TABS
500.0000 mg | ORAL_TABLET | Freq: Two times a day (BID) | ORAL | 0 refills | Status: AC
  Filled 2021-11-08: qty 14, 7d supply, fill #0

## 2021-11-08 NOTE — Addendum Note (Signed)
Addended by: Margarita Mail on: 11/08/2021 12:45 PM   Modules accepted: Orders

## 2021-11-12 LAB — CYTOLOGY - PAP
Adequacy: ABSENT
Comment: NEGATIVE
Diagnosis: NEGATIVE
High risk HPV: NEGATIVE

## 2021-12-07 ENCOUNTER — Other Ambulatory Visit: Payer: Self-pay

## 2021-12-13 ENCOUNTER — Encounter: Payer: Self-pay | Admitting: Internal Medicine

## 2021-12-18 ENCOUNTER — Encounter: Payer: Self-pay | Admitting: Internal Medicine

## 2021-12-18 ENCOUNTER — Ambulatory Visit: Payer: 59 | Admitting: Internal Medicine

## 2021-12-18 ENCOUNTER — Other Ambulatory Visit: Payer: Self-pay

## 2021-12-18 VITALS — BP 126/70 | HR 92 | Temp 98.2°F | Resp 16 | Ht 62.25 in | Wt 206.3 lb

## 2021-12-18 DIAGNOSIS — Z23 Encounter for immunization: Secondary | ICD-10-CM

## 2021-12-18 DIAGNOSIS — F331 Major depressive disorder, recurrent, moderate: Secondary | ICD-10-CM | POA: Diagnosis not present

## 2021-12-18 DIAGNOSIS — K582 Mixed irritable bowel syndrome: Secondary | ICD-10-CM

## 2021-12-18 MED ORDER — PANTOPRAZOLE SODIUM 20 MG PO TBEC
20.0000 mg | DELAYED_RELEASE_TABLET | Freq: Every day | ORAL | 1 refills | Status: DC
  Filled 2021-12-18: qty 90, 90d supply, fill #0

## 2021-12-18 MED ORDER — SERTRALINE HCL 100 MG PO TABS
100.0000 mg | ORAL_TABLET | Freq: Every day | ORAL | 3 refills | Status: DC
  Filled 2021-12-18: qty 30, 30d supply, fill #0
  Filled 2022-01-17: qty 30, 30d supply, fill #1

## 2021-12-18 NOTE — Patient Instructions (Addendum)
It was great seeing you today!  Plan discussed at today's visit: -Stop birth control pills -Increase Zoloft to 100 mg  -Start Pantoprazole 20 mg as needed -Referral to GI placed   Follow up in: 6 weeks   Take care and let us know if you have any questions or concerns prior to your next visit.  Dr. Caralee Ates  Low-FODMAP Eating Plan  FODMAP stands for fermentable oligosaccharides, disaccharides, monosaccharides, and polyols. These are sugars that are hard for some people to digest. A low-FODMAP eating plan may help some people who have irritable bowel syndrome (IBS) and certain other bowel (intestinal) diseases to manage their symptoms. This meal plan can be complicated to follow. Work with a diet and nutrition specialist (dietitian) to make a low-FODMAP eating plan that is right for you. A dietitian can help make sure that you get enough nutrition from this diet. What are tips for following this plan? Reading food labels Check labels for hidden FODMAPs such as: High-fructose syrup. Honey. Agave. Natural fruit flavors. Onion or garlic powder. Choose low-FODMAP foods that contain 3-4 grams of fiber per serving. Check food labels for serving sizes. Eat only one serving at a time to make sure FODMAP levels stay low. Shopping Shop with a list of foods that are recommended on this diet and make a meal plan. Meal planning Follow a low-FODMAP eating plan for up to 6 weeks, or as told by your health care provider or dietitian. To follow the eating plan: Eliminate high-FODMAP foods from your diet completely. Choose only low-FODMAP foods to eat. You will do this for 2-6 weeks. Gradually reintroduce high-FODMAP foods into your diet one at a time. Most people should wait a few days before introducing the next new high-FODMAP food into their meal plan. Your dietitian can recommend how quickly you may reintroduce foods. Keep a daily record of what and how much you eat and drink. Make note of any  symptoms that you have after eating. Review your daily record with a dietitian regularly to identify which foods you can eat and which foods you should avoid. General tips Drink enough fluid each day to keep your urine pale yellow. Avoid processed foods. These often have added sugar and may be high in FODMAPs. Avoid most dairy products, whole grains, and sweeteners. Work with a dietitian to make sure you get enough fiber in your diet. Avoid high FODMAP foods at meals to manage symptoms. Recommended foods Fruits Bananas, oranges, tangerines, lemons, limes, blueberries, raspberries, strawberries, grapes, cantaloupe, honeydew melon, kiwi, papaya, passion fruit, and pineapple. Limited amounts of dried cranberries, banana chips, and shredded coconut. Vegetables Eggplant, zucchini, cucumber, peppers, green beans, bean sprouts, lettuce, arugula, kale, Swiss chard, spinach, collard greens, bok choy, summer squash, potato, and tomato. Limited amounts of corn, carrot, and sweet potato. Green parts of scallions. Grains Gluten-free grains, such as rice, oats, buckwheat, quinoa, corn, polenta, and millet. Gluten-free pasta, bread, or cereal. Rice noodles. Corn tortillas. Meats and other proteins Unseasoned beef, pork, poultry, or fish. Eggs. Tomasa Blase. Tofu (firm) and tempeh. Limited amounts of nuts and seeds, such as almonds, walnuts, [place] nuts, pecans, peanuts, nut butters, pumpkin seeds, chia seeds, and sunflower seeds. Dairy Lactose-free milk, yogurt, and kefir. Lactose-free cottage cheese and ice cream. Non-dairy milks, such as almond, coconut, hemp, and rice milk. Non-dairy yogurt. Limited amounts of goat cheese, brie, mozzarella, parmesan, swiss, and other hard cheeses. Fats and oils Butter-free spreads. Vegetable oils, such as olive, canola, and sunflower oil. Seasoning and other  foods Artificial sweeteners with names that do not end in "ol," such as aspartame, saccharine, and stevia. Maple syrup,  white table sugar, raw sugar, brown sugar, and molasses. Mayonnaise, soy sauce, and tamari. Fresh basil, coriander, parsley, rosemary, and thyme. Beverages Water and mineral water. Sugar-sweetened soft drinks. Small amounts of orange juice or cranberry juice. Black and green tea. Most dry wines. Coffee. The items listed above may not be a complete list of foods and beverages you can eat. Contact a dietitian for more information. Foods to avoid Fruits Fresh, dried, and juiced forms of apple, pear, watermelon, peach, plum, cherries, apricots, blackberries, boysenberries, figs, nectarines, and mango. Avocado. Vegetables Chicory root, artichoke, asparagus, cabbage, snow peas, Brussels sprouts, broccoli, sugar snap peas, mushrooms, celery, and cauliflower. Onions, garlic, leeks, and the white part of scallions. Grains Wheat, including kamut, durum, and semolina. Barley and bulgur. Couscous. Wheat-based cereals. Wheat noodles, bread, crackers, and pastries. Meats and other proteins Fried or fatty meat. Sausage. Cashews and pistachios. Soybeans, baked beans, black beans, chickpeas, kidney beans, fava beans, navy beans, lentils, black-eyed peas, and split peas. Dairy Milk, yogurt, ice cream, and soft cheese. Cream and sour cream. Milk-based sauces. Custard. Buttermilk. Soy milk. Seasoning and other foods Any sugar-free gum or candy. Foods that contain artificial sweeteners such as sorbitol, mannitol, isomalt, or xylitol. Foods that contain honey, high-fructose corn syrup, or agave. Bouillon, vegetable stock, beef stock, and chicken stock. Garlic and onion powder. Condiments made with onion, such as hummus, chutney, pickles, relish, salad dressing, and salsa. Tomato paste. Beverages Chicory-based drinks. Coffee substitutes. Chamomile tea. Fennel tea. Sweet or fortified wines such as port or sherry. Diet soft drinks made with isomalt, mannitol, maltitol, sorbitol, or xylitol. Apple, pear, and mango juice.  Juices with high-fructose corn syrup. The items listed above may not be a complete list of foods and beverages you should avoid. Contact a dietitian for more information. Summary FODMAP stands for fermentable oligosaccharides, disaccharides, monosaccharides, and polyols. These are sugars that are hard for some people to digest. A low-FODMAP eating plan is a short-term diet that helps to ease symptoms of certain bowel diseases. The eating plan usually lasts up to 6 weeks. After that, high-FODMAP foods are reintroduced gradually and one at a time. This can help you find out which foods may be causing symptoms. A low-FODMAP eating plan can be complicated. It is best to work with a dietitian who has experience with this type of plan. This information is not intended to replace advice given to you by your health care provider. Make sure you discuss any questions you have with your health care provider. Document Revised: 08/12/2019 Document Reviewed: 08/12/2019 Elsevier Patient Education  2023 [employer].

## 2021-12-18 NOTE — Progress Notes (Signed)
Established Patient Office Visit  Subjective   Patient ID: April Buck, female    DOB: 1985-04-09  Age: 36 y.o. MRN: [id number]  Chief Complaint  Patient presents with   Follow-up   Abdominal Pain    Wants referral to GI    HPI  Patient is here for follow up on chronic medical conditions.   PCOS: -History of irregular periods and heavy bleeding with painful cramping  -Hx of multiple ovarian cysts burst, very painful  -Currently sexually active with husband -Hx of tubal ligation -Seen by Gynecology, note, labs and pelvic US reviewed from 09/06/21. Pelvic US showing ovarian cysts consistent with PCOS -Used to be on oral birth control and had done well with it. Has a remote history of migraines but none in the last 5 years -No issues with blood pressure, non-smoker -Family history of mother going through menopause in her 20s -Started on OCP about 6 weeks ago but moods have been much worse  MDD: -Mood status: exacerbated -Current treatment: Zoloft 50 mg increased at [place] -Satisfied with current treatment?: no -Symptom severity: severe  -Duration of current treatment : months -Side effects: no Medication compliance: excellent compliance     12/18/2021    2:13 PM 11/06/2021    2:00 PM 09/06/2021    3:33 PM 08/14/2021    2:14 PM  Depression screen PHQ 2/9  Decreased Interest 3 1 0 3  Down, Depressed, Hopeless 2 0 0 2  PHQ - 2 Score 5 1 0 5  Altered sleeping 3 0 1 3  Tired, decreased energy 3 0 1 3  Change in appetite 0 0 0 3  Feeling bad or failure about yourself  2 0 0 2  Trouble concentrating 2 0 0 3  Moving slowly or fidgety/restless 0 0 0 0  Suicidal thoughts 0 0 0 0  PHQ-9 Score 15 1 2 19   Difficult doing work/chores Very difficult Not difficult at all Somewhat difficult Somewhat difficult   Hx of genital HSV: -Currently on Valacyclovir 500 mg as needed - usually has flares about once a month but hasn't had a flare since February 2023  Abdominal Complaint: -Lower  abdominal pain, diarrhea -worse with eating, immediately after eating. Comes and goes  -Going on for years  -Triggers: pork, salt, cow's milk, gravy  -Duration: on and off for years -Constipation: yes -Diarrhea: yes -Heartburn: yes -Bloating:yes -Nausea: no -Vomiting: no -Melena or hematochezia: no    Review of Systems  Constitutional:  Negative for chills and fever.  Respiratory:  Negative for shortness of breath.   Cardiovascular:  Negative for chest pain.  Gastrointestinal:  Positive for abdominal pain, constipation, diarrhea and heartburn. Negative for nausea.  Psychiatric/Behavioral:  Positive for depression.       Objective:     BP 126/70   Pulse 92   Temp 98.2 F (36.8 C)   Resp 16   Ht 5' 2.25" (1.581 m)   Wt 206 lb 4.8 oz (93.6 kg)   LMP 12/07/2021   SpO2 99%   BMI 37.43 kg/m  BP Readings from Last 3 Encounters:  12/18/21 126/70  11/06/21 116/76  09/06/21 100/60   Wt Readings from Last 3 Encounters:  12/18/21 206 lb 4.8 oz (93.6 kg)  11/06/21 214 lb 12.8 oz (97.4 kg)  09/06/21 230 lb (104.3 kg)      Physical Exam Constitutional:      Appearance: She is well-developed.  HENT:     Head: Normocephalic and atraumatic.  Eyes:     Conjunctiva/sclera: Conjunctivae normal.  Cardiovascular:     Rate and Rhythm: Normal rate and regular rhythm.  Pulmonary:     Effort: Pulmonary effort is normal.     Breath sounds: Normal breath sounds.  Musculoskeletal:     Right lower leg: No edema.     Left lower leg: No edema.  Skin:    General: Skin is warm and dry.  Neurological:     General: No focal deficit present.     Mental Status: She is alert. Mental status is at baseline.  Psychiatric:        Mood and Affect: Mood normal.        Behavior: Behavior normal.     No results found for any visits on 12/18/21.  Last CBC Lab Results  Component Value Date   WBC 8.4 08/14/2021   HGB 14.0 08/14/2021   HCT 41.0 08/14/2021   MCV 87.8 08/14/2021   MCH  30.0 08/14/2021   RDW 13.1 08/14/2021   PLT 231 70/62/3762   Last metabolic panel Lab Results  Component Value Date   GLUCOSE 93 11/06/2021   NA 137 11/06/2021   K 3.8 11/06/2021   CL 105 11/06/2021   CO2 24 11/06/2021   BUN 6 (L) 11/06/2021   CREATININE 0.65 11/06/2021   EGFR 118 11/06/2021   CALCIUM 9.3 11/06/2021   PROT 6.7 11/06/2021   ALBUMIN 4.4 06/08/2015   BILITOT 0.6 11/06/2021   ALKPHOS 45 06/08/2015   AST 18 11/06/2021   ALT 26 11/06/2021   ANIONGAP 5 07/03/2015   Last lipids Lab Results  Component Value Date   CHOL 158 08/14/2021   HDL 42 (L) 08/14/2021   LDLCALC 82 08/14/2021   TRIG 242 (H) 08/14/2021   CHOLHDL 3.8 08/14/2021   Last hemoglobin A1c Lab Results  Component Value Date   HGBA1C 5.5 08/14/2021   Last thyroid functions Lab Results  Component Value Date   TSH 3.52 08/14/2021   Last vitamin D Lab Results  Component Value Date   VD25OH 25.22 (L) 08/17/2015   Last vitamin B12 and Folate Lab Results  Component Value Date   VITAMINB12 203 (L) 08/17/2015   FOLATE >23.3 08/17/2015      The ASCVD Risk score (Arnett DK, et al., 2019) failed to calculate for the following reasons:   The 2019 ASCVD risk score is only valid for ages 55 to 59    Assessment & Plan:   1. Moderate episode of recurrent major depressive disorder ([place]): Stop birth control, may be contributing to worsening moods and tearfulness. Increases Zoloft to 100 mg daily. Follow up in 6 weeks to recheck.   - sertraline (ZOLOFT) 100 MG tablet; Take 1 tablet (100 mg total) by mouth daily.  Dispense: 30 tablet; Refill: 3  2. Irritable bowel syndrome with both constipation and diarrhea: Worse with mood changes. Discussed low FODMAP diet plan. PPI trial - Pantoprazole 20 mg daily. Referral to GI placed today per patient wishes.   - pantoprazole (PROTONIX) 20 MG tablet; Take 1 tablet (20 mg total) by mouth daily.  Dispense: 90 tablet; Refill: 1 - Ambulatory referral to  Gastroenterology  3. Need for influenza vaccination: Flu vaccine administered today.   - Flu Vaccine QUAD 6+ mos PF IM (Fluarix Quad PF)   Return in about 6 weeks (around 01/29/2022).    Teodora Medici, DO

## 2022-01-17 ENCOUNTER — Other Ambulatory Visit: Payer: Self-pay

## 2022-01-29 ENCOUNTER — Ambulatory Visit: Payer: 59 | Admitting: Internal Medicine

## 2022-01-31 ENCOUNTER — Encounter: Payer: Self-pay | Admitting: Internal Medicine

## 2022-01-31 ENCOUNTER — Other Ambulatory Visit: Payer: Self-pay

## 2022-01-31 ENCOUNTER — Ambulatory Visit (INDEPENDENT_AMBULATORY_CARE_PROVIDER_SITE_OTHER): Payer: 59 | Admitting: Internal Medicine

## 2022-01-31 VITALS — BP 122/80 | HR 112 | Temp 98.2°F | Resp 16 | Ht 62.25 in | Wt 204.9 lb

## 2022-01-31 DIAGNOSIS — Z111 Encounter for screening for respiratory tuberculosis: Secondary | ICD-10-CM | POA: Diagnosis not present

## 2022-01-31 DIAGNOSIS — F331 Major depressive disorder, recurrent, moderate: Secondary | ICD-10-CM

## 2022-01-31 DIAGNOSIS — A6000 Herpesviral infection of urogenital system, unspecified: Secondary | ICD-10-CM

## 2022-01-31 DIAGNOSIS — K582 Mixed irritable bowel syndrome: Secondary | ICD-10-CM

## 2022-01-31 MED ORDER — VALACYCLOVIR HCL 500 MG PO TABS
500.0000 mg | ORAL_TABLET | Freq: Every day | ORAL | 1 refills | Status: DC
  Filled 2022-01-31 – 2022-06-03 (×2): qty 90, 90d supply, fill #0

## 2022-01-31 MED ORDER — SERTRALINE HCL 100 MG PO TABS
100.0000 mg | ORAL_TABLET | Freq: Every day | ORAL | 1 refills | Status: DC
  Filled 2022-01-31 – 2022-02-25 (×2): qty 90, 90d supply, fill #0
  Filled 2022-06-03: qty 90, 90d supply, fill #1

## 2022-01-31 NOTE — Progress Notes (Signed)
Established Patient Office Visit  Subjective   Patient ID: April Buck, female    DOB: [birth year]  Age: 36 y.o. MRN: [id number]  Chief Complaint  Patient presents with   Follow-up   Depression    Depression         Patient is here for follow up on chronic medical conditions.   MDD: -Mood status: better -Current treatment: Zoloft 100 mg -Satisfied with current treatment?: yes -Symptom severity: severe  -Duration of current treatment : months -Side effects: no Medication compliance: excellent compliance     01/31/2022    2:12 PM 12/18/2021    2:13 PM 11/06/2021    2:00 PM 09/06/2021    3:33 PM 08/14/2021    2:14 PM  Depression screen PHQ 2/9  Decreased Interest 0 3 1 0 3  Down, Depressed, Hopeless 0 2 0 0 2  PHQ - 2 Score 0 5 1 0 5  Altered sleeping 0 3 0 1 3  Tired, decreased energy 0 3 0 1 3  Change in appetite 0 0 0 0 3  Feeling bad or failure about yourself  0 2 0 0 2  Trouble concentrating 0 2 0 0 3  Moving slowly or fidgety/restless 0 0 0 0 0  Suicidal thoughts 0 0 0 0 0  PHQ-9 Score 0 $Remov'15 1 2 19  'gbTYPA$ Difficult doing work/chores Not difficult at all Very difficult Not difficult at all Somewhat difficult Somewhat difficult   Hx of genital HSV: -Currently on Valacyclovir 500 mg as needed - usually has flares about once a month but hasn't had a flare since February 2023  Abdominal Complaint: -Lower abdominal pain, diarrhea -worse with eating, immediately after eating. Comes and goes  -Going on for years - daily basis, last time this morning  -Triggers: pork, salt, cow's milk, gravy  -Duration: on and off for years -Constipation: yes -Diarrhea: yes -Heartburn: yes -Bloating:yes -Nausea: no -Vomiting: no -Melena or hematochezia: no -Tried PPI trial at last office visit, pantoprazole did not change symptoms.  Patient still having symptoms pretty much on a daily basis.  She does have an appointment with GI in December but would like to rule out an infectious cause  with stool studies today.   Review of Systems  Constitutional:  Negative for chills and fever.  Respiratory:  Negative for shortness of breath.   Cardiovascular:  Negative for chest pain.  Gastrointestinal:  Positive for constipation and diarrhea. Negative for abdominal pain, heartburn and nausea.      Objective:     BP 122/80   Pulse (!) 112   Temp 98.2 F (36.8 C)   Resp 16   Ht 5' 2.25" (1.581 m)   Wt 204 lb 14.4 oz (92.9 kg)   SpO2 98%   BMI 37.18 kg/m  BP Readings from Last 3 Encounters:  01/31/22 122/80  12/18/21 126/70  11/06/21 116/76   Wt Readings from Last 3 Encounters:  01/31/22 204 lb 14.4 oz (92.9 kg)  12/18/21 206 lb 4.8 oz (93.6 kg)  11/06/21 214 lb 12.8 oz (97.4 kg)      Physical Exam Constitutional:      Appearance: Normal appearance. She is well-developed.  HENT:     Head: Normocephalic and atraumatic.  Eyes:     Conjunctiva/sclera: Conjunctivae normal.  Cardiovascular:     Rate and Rhythm: Normal rate and regular rhythm.  Pulmonary:     Effort: Pulmonary effort is normal.     Breath sounds: Normal breath  sounds.  Musculoskeletal:     Right lower leg: No edema.     Left lower leg: No edema.  Skin:    General: Skin is warm and dry.  Neurological:     General: No focal deficit present.     Mental Status: She is alert. Mental status is at baseline.  Psychiatric:        Mood and Affect: Mood normal.        Behavior: Behavior normal.     No results found for any visits on 01/31/22.  Last CBC Lab Results  Component Value Date   WBC 8.4 08/14/2021   HGB 14.0 08/14/2021   HCT 41.0 08/14/2021   MCV 87.8 08/14/2021   MCH 30.0 08/14/2021   RDW 13.1 08/14/2021   PLT 231 16/01/9603   Last metabolic panel Lab Results  Component Value Date   GLUCOSE 93 11/06/2021   NA 137 11/06/2021   K 3.8 11/06/2021   CL 105 11/06/2021   CO2 24 11/06/2021   BUN 6 (L) 11/06/2021   CREATININE 0.65 11/06/2021   EGFR 118 11/06/2021   CALCIUM 9.3  11/06/2021   PROT 6.7 11/06/2021   ALBUMIN 4.4 06/08/2015   BILITOT 0.6 11/06/2021   ALKPHOS 45 06/08/2015   AST 18 11/06/2021   ALT 26 11/06/2021   ANIONGAP 5 07/03/2015   Last lipids Lab Results  Component Value Date   CHOL 158 08/14/2021   HDL 42 (L) 08/14/2021   LDLCALC 82 08/14/2021   TRIG 242 (H) 08/14/2021   CHOLHDL 3.8 08/14/2021   Last hemoglobin A1c Lab Results  Component Value Date   HGBA1C 5.5 08/14/2021   Last thyroid functions Lab Results  Component Value Date   TSH 3.52 08/14/2021   Last vitamin D Lab Results  Component Value Date   VD25OH 25.22 (L) 08/17/2015   Last vitamin B12 and Folate Lab Results  Component Value Date   VITAMINB12 203 (L) 08/17/2015   FOLATE >23.3 08/17/2015      The ASCVD Risk score (Arnett DK, et al., 2019) failed to calculate for the following reasons:   The 2019 ASCVD risk score is only valid for ages 64 to 18    Assessment & Plan:   1. Irritable bowel syndrome with both constipation and diarrhea: Symptoms consistent with IBS with diarrhea, however patient would like to rule out infectious cause.  Stool cultures, C. difficile, O&P and calprotectin will be obtained today.  The patient does have an appoint with GI in December.  We did discuss starting something like Xifaxan to help with IBS symptoms, however patient is worried this will be too expensive.  We tried PPI trial, which did not change her symptoms, discontinue pantoprazole.  - C. difficile GDH and Toxin A/B - Ova and parasite examination - CALPROTECTIN - Salmonella/Shigella Cult, Campy EIA and Shiga Toxin reflex  2. Moderate episode of recurrent major depressive disorder [facility]): Moods doing much better on increased Zoloft dose, continue Zoloft 100 mg daily, refilled today.  - sertraline (ZOLOFT) 100 MG tablet; Take 1 tablet (100 mg total) by mouth daily.  Dispense: 90 tablet; Refill: 1  3. Recurrent genital herpes: Valtrex refilled today.  - valACYclovir  (VALTREX) 500 MG tablet; Take 1 tablet (500 mg total) by mouth daily.  Dispense: 90 tablet; Refill: 1  4. 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  - QuantiFERON-TB Gold Plus   Return in about 6 months (around 08/02/2022).  Teodora Medici, DO

## 2022-01-31 NOTE — Patient Instructions (Signed)
It was great seeing you today!  Plan discussed at today's visit: -Blood work ordered today, results will be uploaded to MyChart.  -Stool cultures today -Medications refilled  Follow up in: 6 months or sooner as needed.  Take care and let us know if you have any questions or concerns prior to your next visit.  Dr. Rosana Berger

## 2022-02-06 LAB — QUANTIFERON-TB GOLD PLUS
Mitogen-NIL: >10 IU/mL
NIL: 0.09 IU/mL
QuantiFERON-TB Gold Plus: NEGATIVE
TB1-NIL: 0.01 IU/mL
TB2-NIL: 0.04 IU/mL

## 2022-02-10 ENCOUNTER — Encounter: Payer: Self-pay | Admitting: Internal Medicine

## 2022-02-15 ENCOUNTER — Other Ambulatory Visit: Payer: Self-pay

## 2022-02-25 ENCOUNTER — Encounter: Payer: Self-pay | Admitting: Internal Medicine

## 2022-02-25 ENCOUNTER — Ambulatory Visit: Payer: 59 | Admitting: Internal Medicine

## 2022-02-25 ENCOUNTER — Other Ambulatory Visit: Payer: Self-pay

## 2022-02-25 ENCOUNTER — Ambulatory Visit: Payer: Self-pay | Admitting: *Deleted

## 2022-02-25 VITALS — BP 118/72 | HR 99 | Temp 98.1°F | Resp 18 | Ht 62.5 in | Wt 205.6 lb

## 2022-02-25 DIAGNOSIS — H6992 Unspecified Eustachian tube disorder, left ear: Secondary | ICD-10-CM

## 2022-02-25 DIAGNOSIS — J3489 Other specified disorders of nose and nasal sinuses: Secondary | ICD-10-CM | POA: Diagnosis not present

## 2022-02-25 DIAGNOSIS — H9202 Otalgia, left ear: Secondary | ICD-10-CM

## 2022-02-25 MED ORDER — METHYLPREDNISOLONE 4 MG PO TBPK
ORAL_TABLET | ORAL | 0 refills | Status: DC
  Filled 2022-02-25: qty 21, 21d supply, fill #0

## 2022-02-25 NOTE — Progress Notes (Signed)
Acute Office Visit  Subjective:     Patient ID: April Buck, female    DOB: 12/05/1985, 36 y.o.   MRN: [id number]  Chief Complaint  Patient presents with   Ear Pain    left    HPI Patient is in today for ear pain.  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  HENT:  Positive for ear discharge, ear pain, sinus pain and sore throat. Negative for congestion, hearing loss and tinnitus.   Respiratory:  Negative for cough and shortness of breath.   Cardiovascular:  Negative for chest pain.       Objective:    BP 118/72   Pulse 99   Temp 98.1 F (36.7 C)   Resp 18   Ht 5' 2.5" (1.588 m)   Wt 205 lb 9.6 oz (93.3 kg)   SpO2 94%   BMI 37.01 kg/m  BP Readings from Last 3 Encounters:  02/25/22 118/72  01/31/22 122/80  12/18/21 126/70   Wt Readings from Last 3 Encounters:  02/25/22 205 lb 9.6 oz (93.3 kg)  01/31/22 204 lb 14.4 oz (92.9 kg)  12/18/21 206 lb 4.8 oz (93.6 kg)      Physical Exam Constitutional:      Appearance: Normal appearance.  HENT:     Head: Normocephalic and atraumatic.     Right Ear: Ear canal and external ear normal.     Left Ear: Ear canal and external ear normal.     Ears:     Comments: New daith type ear piercing on the right - red but no signs of infection. Left eustachian tube dysfunction     Nose: No congestion.     Mouth/Throat:     Mouth: Mucous membranes are moist.     Pharynx: Posterior oropharyngeal erythema present.     Comments: Enlarged tonsils Eyes:     Conjunctiva/sclera: Conjunctivae normal.  Cardiovascular:     Rate and Rhythm: Normal  rate and regular rhythm.  Pulmonary:     Effort: Pulmonary effort is normal.     Breath sounds: Normal breath sounds. No wheezing, rhonchi or rales.  Skin:    General: Skin is warm and dry.  Neurological:     General: No focal deficit present.     Mental Status: She is alert. Mental status is at baseline.  Psychiatric:        Mood and Affect: Mood normal.        Behavior: Behavior normal.     No results found for any visits on 02/25/22.      Assessment & Plan:   1. Left ear pain/Dysfunction of left eustachian tube/Sinus pressure: No acute ear infection, pain is most likely referred from sinuses. Treat with Medrol dose pack. Follow up if symptoms worsen or fail to improve.   - methylPREDNISolone (MEDROL DOSEPAK) 4 MG TBPK tablet; Day 1: Take 8 mg (2 tablets) before breakfast, 4 mg (1 tablet) after lunch, 4 mg (1 tablet) after supper, and 8 mg (2 tablets) at bedtime. Day 2:Take 4 mg (1 tablet) before breakfast, 4 mg (1 tablet) after lunch, 4 mg (1 tablet)  after supper, and 8 mg (2 tablets) at bedtime. Day 3: Take 4 mg (1 tablet) before breakfast, 4 mg (1 tablet) after lunch, 4 mg (1 tablet) after supper, and 4 mg (1 tablet) at bedtime. Day 4: Take 4 mg (1 tablet) before breakfast, 4 mg (1 tablet) after lunch, and 4 mg (1 tablet) at bedtime. Day 5: Take 4 mg (1 tablet) before breakfast and 4 mg (1 tablet) at bedtime. Day 6: Take 4 mg (1 tablet) before breakfast.  Dispense: 21 tablet; Refill: 0   Return if symptoms worsen or fail to improve.  Teodora Medici, DO

## 2022-02-25 NOTE — Telephone Encounter (Signed)
Reason for Disposition . [1] SEVERE pain AND [2] not improved 2 hours after taking analgesic medication (e.g., ibuprofen or acetaminophen)  Answer Assessment - Initial Assessment Questions 1. LOCATION: "Which ear is involved?"     Left ear 2. ONSET: "When did the ear start hurting"      2 days- last night worse 3. SEVERITY: "How bad is the pain?"  (Scale 1-10; mild, moderate or severe)   - MILD (1-3): doesn't interfere with normal activities    - MODERATE (4-7): interferes with normal activities or awakens from sleep    - SEVERE (8-10): excruciating pain, unable to do any normal activities      8- severe 4. URI SYMPTOMS: "Do you have a runny nose or cough?"     1 week ago- OTC 5. FEVER: "Do you have a fever?" If Yes, ask: "What is your temperature, how was it measured, and when did it start?"     no  Protocols used: Earache-A-AH

## 2022-02-25 NOTE — Telephone Encounter (Signed)
  Chief Complaint: ear pain-left Symptoms: ear pain, congestion Frequency: 2 days Pertinent Negatives: Patient denies fever Disposition: [] ED /[] Urgent Care (no appt availability in office) / [x] Appointment(In office/virtual)/ []  [facility]/ [] Home Care/ [] Refused Recommended Disposition /[] [facility]/ []  Follow-up with PCP Additional Notes:  Patient has been scheduled by agent- advised confort measures until appointment

## 2022-03-18 ENCOUNTER — Encounter: Payer: Self-pay | Admitting: Gastroenterology

## 2022-03-18 ENCOUNTER — Ambulatory Visit: Payer: 59 | Admitting: Gastroenterology

## 2022-03-18 ENCOUNTER — Other Ambulatory Visit: Payer: Self-pay

## 2022-03-18 VITALS — BP 126/85 | HR 102 | Temp 98.5°F | Ht 62.5 in | Wt 209.1 lb

## 2022-03-18 DIAGNOSIS — G8929 Other chronic pain: Secondary | ICD-10-CM

## 2022-03-18 DIAGNOSIS — R1031 Right lower quadrant pain: Secondary | ICD-10-CM

## 2022-03-18 DIAGNOSIS — K529 Noninfective gastroenteritis and colitis, unspecified: Secondary | ICD-10-CM

## 2022-03-18 MED ORDER — DICYCLOMINE HCL 10 MG PO CAPS
10.0000 mg | ORAL_CAPSULE | Freq: Three times a day (TID) | ORAL | 0 refills | Status: DC
  Filled 2022-03-18: qty 18, 6d supply, fill #0
  Filled 2022-03-18: qty 12, 4d supply, fill #0

## 2022-03-18 MED ORDER — NA SULFATE-K SULFATE-MG SULF 17.5-3.13-1.6 GM/177ML PO SOLN
354.0000 mL | Freq: Once | ORAL | 0 refills | Status: AC
  Filled 2022-03-18: qty 354, 1d supply, fill #0

## 2022-03-18 NOTE — Progress Notes (Signed)
Arlyss Repress, MD [address]  [address]  [place], [place] [address]  Main: [phone]  Fax: [phone]    Gastroenterology Consultation  Referring Provider:     Margarita Mail, DO Primary Care Physician:  Margarita Mail, DO Primary Gastroenterologist:  Dr. Arlyss Repress Reason for Consultation: Right lower quadrant pain, postprandial urgency        HPI:   April Buck is a 36 y.o. female referred by Dr. Margarita Mail, DO  for consultation & management of PCOS, depression, chronic GERD is seen in consultation for approximately 5 years history of right lower quadrant discomfort and lower abdominal cramps associated with bowel urgency every time she eats.  These episodes occur about 4-5 times daily.  She denies nocturnal diarrhea.  She had cholecystectomy in 05/2013 secondary to acute calculus cholecystitis.  Patient reports that she noticed her GI symptoms since then approximately.  She reports trying IBgard, FD guard, antidiarrheal medications as well.  She sometimes has heartburn for which she tried OTC PPI.  Patient lost about 40 pounds with diet modification and exercise since Mah[place] 2023.  This did not improve her symptoms.  She denies any rectal bleeding. Labs including CBC, CMP are unremarkable.  Her PCP ordered stool studies which she did not undergo yet.  No evidence of anemia She does not smoke or drink alcohol  First cousin with colon cancer in his 58s, passed away  NSAIDs: None  Antiplts/Anticoagulants/Anti thrombotics: None  GI Procedures: None  Past Medical History:  Diagnosis Date   Depression    Frequent headaches    GERD (gastroesophageal reflux disease)    Herpes    Migraines    PCOS (polycystic ovarian syndrome)    Seasonal allergies     Past Surgical History:  Procedure Laterality Date   CESAREAN SECTION     2   CHOLECYSTECTOMY     TUBAL LIGATION       Current Outpatient Medications:    dicyclomine (BENTYL) 10 MG  capsule, Take 1 capsule (10 mg total) by mouth 3 (three) times daily before meals. As needed, Disp: 30 capsule, Rfl: 0   Na Sulfate-K Sulfate-Mg Sulf 17.5-3.13-1.6 GM/177ML SOLN, Take 354 mLs by mouth once for 1 dose., Disp: 354 mL, Rfl: 0   sertraline (ZOLOFT) 100 MG tablet, Take 1 tablet (100 mg total) by mouth daily., Disp: 90 tablet, Rfl: 1   valACYclovir (VALTREX) 500 MG tablet, Take 1 tablet (500 mg total) by mouth daily., Disp: 90 tablet, Rfl: 1   Family History  Problem Relation Age of Onset   Diabetes Mother    Hypertension Mother    Heart attack Maternal Aunt    Diabetes Maternal Aunt    Heart attack Maternal Grandmother    Diabetes Maternal Grandmother    Skin cancer Maternal Grandfather    Breast cancer Paternal Grandmother        44s   Rectal cancer Cousin      Social History   Tobacco Use   Smoking status: Never   Smokeless tobacco: Never  Vaping Use   Vaping Use: Never used  Substance Use Topics   Alcohol use: Not Currently    Comment: rare   Drug use: No    Allergies as of 03/18/2022 - Review Complete 03/18/2022  Allergen Reaction Noted   Latex Hives and Swelling 06/08/2015    Review of Systems:    All systems reviewed and negative except where noted in HPI.   Physical Exam:  BP 126/85 (BP Location: Left Arm, Patient Position: Sitting, Cuff Size: Normal)   Pulse (!) 102   Temp 98.5 F (36.9 C) (Oral)   Ht 5' 2.5" (1.588 m)   Wt 209 lb 2 oz (94.9 kg)   BMI 37.64 kg/m  No LMP recorded. (Menstrual status: Irregular Periods).  General:   Alert,  Well-developed, well-nourished, pleasant and cooperative in NAD Head:  Normocephalic and atraumatic. Eyes:  Sclera clear, no icterus.   Conjunctiva pink. Ears:  Normal auditory acuity. Nose:  No deformity, discharge, or lesions. Mouth:  No deformity or lesions,oropharynx pink & moist. Neck:  Supple; no masses or thyromegaly. Lungs:  Respirations even and unlabored.  Clear throughout to auscultation.   No  wheezes, crackles, or rhonchi. No acute distress. Heart:  Regular rate and rhythm; no murmurs, clicks, rubs, or gallops. Abdomen:  Normal bowel sounds. Soft, mild right lower quadrant tenderness on deep palpation and non-distended without masses, hepatosplenomegaly or hernias noted.  No guarding or rebound tenderness.   Rectal: Not performed Msk:  Symmetrical without gross deformities. Good, equal movement & strength bilaterally. Pulses:  Normal pulses noted. Extremities:  No clubbing or edema.  No cyanosis. Neurologic:  Alert and oriented x3;  grossly normal neurologically. Skin:  Intact without significant lesions or rashes. No jaundice. Psych:  Alert and cooperative. Normal mood and affect.  Imaging Studies: Reviewed  Assessment and Plan:   April Buck is a 36 y.o. female with metabolic syndrome, PCOS, s/p cholecystectomy secondary to acute calculus cholecystitis in 2015 is seen in consultation for several years history of right lower quadrant pain, lower abdominal cramps with severe postprandial urgency.  40 pound intentional weight loss with diet modification and exercise  Advised patient to undergo stool studies recommended by her PCP Recommend upper endoscopy with gastric and duodenal biopsies, colonoscopy with possible TI evaluation and random colon biopsies Trial of Bentyl 10 mg before each meal as needed   Follow up after the above workup Contact via MyChart as needed   Arlyss Repress, MD

## 2022-03-21 ENCOUNTER — Telehealth: Payer: Self-pay | Admitting: Internal Medicine

## 2022-03-21 NOTE — Telephone Encounter (Signed)
Copied from CRM [id number]. 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 She already had a physical in 8/23, but needs this kind of paperwork filled out.  Can this be done or she has to see specialists for this. Please call back

## 2022-03-22 NOTE — Telephone Encounter (Signed)
Tried to call mailbox full could not leave VM.  At her age for CPE we do not get hearing or vision screening unless you have a form with you then stating you need it.  So she will have to schedule an appt for a form completion.

## 2022-04-02 ENCOUNTER — Telehealth: Payer: Self-pay

## 2022-04-02 NOTE — Telephone Encounter (Signed)
Pt lmovm requesting to cancel and r/s upcoming procedure as her and her family are sick

## 2022-04-03 ENCOUNTER — Ambulatory Visit: Admission: RE | Admit: 2022-04-03 | Payer: 59 | Source: Home / Self Care | Admitting: Gastroenterology

## 2022-04-03 ENCOUNTER — Encounter: Admission: RE | Payer: Self-pay | Source: Home / Self Care

## 2022-04-03 SURGERY — COLONOSCOPY WITH PROPOFOL
Anesthesia: General

## 2022-04-04 ENCOUNTER — Ambulatory Visit: Payer: 59

## 2022-04-09 ENCOUNTER — Ambulatory Visit (INDEPENDENT_AMBULATORY_CARE_PROVIDER_SITE_OTHER): Payer: 59

## 2022-04-09 DIAGNOSIS — Z23 Encounter for immunization: Secondary | ICD-10-CM | POA: Diagnosis not present

## 2022-04-11 NOTE — Progress Notes (Signed)
   Acute Office Visit  Subjective:     Patient ID: April Buck, female    DOB: 02-24-1986, 37 y.o.   MRN: [id number]  Chief Complaint  Patient presents with   Back Pain    Lower left side, feels knot and catches/pulls when she walks    HPI Patient is in today for back pain.  BACK PAIN Duration:  1 month - was hiking and fell on a rock  Mechanism of injury:  fall Location: Left, midline, and low back Onset: gradual Quality: sharp and stabbing Frequency: constant Radiation: doesn't radiate but area of pain will increase  Aggravating factors: lifting, movement, walking, bending, and prolonged sitting Alleviating factors: nothing Status: worse Treatments attempted: heat and ibuprofen  Relief with NSAIDs?: no Nighttime pain:  no Paresthesias / decreased sensation:  no Bowel / bladder incontinence:  no Dysuria / urinary frequency:  no   Review of Systems  Constitutional:  Negative for chills and fever.  Genitourinary:  Negative for dysuria, flank pain and hematuria.  Musculoskeletal:  Positive for back pain.  Skin: Negative.   Neurological:  Positive for weakness. Negative for tingling.        Objective:    BP 122/68   Pulse (!) 104   Temp 97.9 F (36.6 C)   Resp 16   Ht 5' 2.5" (1.588 m)   Wt 216 lb (98 kg)   SpO2 99%   BMI 38.88 kg/m  BP Readings from Last 3 Encounters:  04/12/22 122/68  03/18/22 126/85  02/25/22 118/72   Wt Readings from Last 3 Encounters:  04/12/22 216 lb (98 kg)  03/18/22 209 lb 2 oz (94.9 kg)  02/25/22 205 lb 9.6 oz (93.3 kg)      Physical Exam Constitutional:      Appearance: Normal appearance.  HENT:     Head: Normocephalic and atraumatic.  Eyes:     Conjunctiva/sclera: Conjunctivae normal.  Musculoskeletal:     Lumbar back: Swelling and tenderness present. No deformity. Decreased range of motion.     Comments: Pain to palpation just left to midline at the level of L1, decreased range of motion with flexion and  extension   Skin:    General: Skin is warm and dry.  Neurological:     General: No focal deficit present.     Mental Status: She is alert. Mental status is at baseline.  Psychiatric:        Mood and Affect: Mood normal.        Behavior: Behavior normal.     No results found for any visits on 04/12/22.      Assessment & Plan:   1. Acute left-sided low back pain without sciatica: Most likely bone bruise vs. Hematoma vs. Muscle spasms. Swelling on exam with pain and decreased ROM. Obtain an x-ray today. Treat with scheduled anti-inflammatories and muscle relaxer as needed. Rest and heat. Start gentle stretching in a few weeks as tolerated.   - naproxen (NAPROSYN) 500 MG tablet; Take 1 tablet (500 mg total) by mouth 2 (two) times daily with a meal for 10 days.  Dispense: 20 tablet; Refill: 0 - tiZANidine (ZANAFLEX) 4 MG tablet; Take 1 tablet (4 mg total) by mouth at bedtime as needed for muscle spasms.  Dispense: 30 tablet; Refill: 0 - DG Lumbar Spine Complete; Future   Return if symptoms worsen or fail to improve.  Teodora Medici, DO

## 2022-04-12 ENCOUNTER — Ambulatory Visit
Admission: RE | Admit: 2022-04-12 | Discharge: 2022-04-12 | Disposition: A | Discharge status: Home / Self Care | Payer: 59 | Attending: Internal Medicine | Admitting: Internal Medicine

## 2022-04-12 ENCOUNTER — Ambulatory Visit
Admission: RE | Admit: 2022-04-12 | Discharge: 2022-04-12 | Disposition: A | Discharge status: Home / Self Care | Payer: 59 | Source: Ambulatory Visit | Attending: Internal Medicine | Admitting: Internal Medicine

## 2022-04-12 ENCOUNTER — Encounter: Payer: Self-pay | Admitting: Internal Medicine

## 2022-04-12 ENCOUNTER — Other Ambulatory Visit: Payer: Self-pay

## 2022-04-12 ENCOUNTER — Ambulatory Visit (INDEPENDENT_AMBULATORY_CARE_PROVIDER_SITE_OTHER): Payer: 59 | Admitting: Internal Medicine

## 2022-04-12 VITALS — BP 122/68 | HR 104 | Temp 97.9°F | Resp 16 | Ht 62.5 in | Wt 216.0 lb

## 2022-04-12 DIAGNOSIS — M545 Low back pain, unspecified: Secondary | ICD-10-CM

## 2022-04-12 MED ORDER — NAPROXEN 500 MG PO TABS
500.0000 mg | ORAL_TABLET | Freq: Two times a day (BID) | ORAL | 0 refills | Status: AC
  Filled 2022-04-12 – 2022-06-03 (×2): qty 20, 10d supply, fill #0

## 2022-04-12 MED ORDER — TIZANIDINE HCL 4 MG PO TABS
4.0000 mg | ORAL_TABLET | Freq: Every evening | ORAL | 0 refills | Status: DC | PRN
  Filled 2022-04-12 – 2022-06-03 (×2): qty 30, 30d supply, fill #0

## 2022-04-17 ENCOUNTER — Encounter: Payer: Self-pay | Admitting: Internal Medicine

## 2022-04-19 ENCOUNTER — Other Ambulatory Visit: Payer: Self-pay

## 2022-06-03 ENCOUNTER — Other Ambulatory Visit: Payer: Self-pay

## 2022-06-20 ENCOUNTER — Other Ambulatory Visit: Payer: Self-pay

## 2022-08-01 NOTE — Progress Notes (Signed)
Established Patient Office Visit  Subjective   Patient ID: April Buck, female    DOB: [birth year]  Age: 37 y.o. MRN: [id number]  Chief Complaint  Patient presents with   Follow-up    Patient is here for follow up on chronic medical conditions.  Since her last office visit, the patient states that she has been experiencing worsening fatigue and joint pains.  She states she has pain in her shoulders, elbows, hips and knees.  Pain seems to be worse at night and affecting her sleep but does occur during the day as well.  Of note patient recently started nursing school.  She also started having migraines again.  So far she has had 2 migraines in the last month.  She currently is not on treatment for migraines.  She had been on an injection in the past for this which she did not like and oral medication as well.  MDD: -Mood status: stable -Current treatment: Zoloft 100 mg -Satisfied with current treatment?: yes -Symptom severity: severe  -Duration of current treatment : months -Side effects: no Medication compliance: excellent compliance     08/02/2022   11:15 AM 04/12/2022    8:07 AM 04/12/2022    8:04 AM 02/25/2022    1:04 PM 01/31/2022    2:12 PM  Depression screen PHQ 2/9  Decreased Interest 1 0 0 0 0  Down, Depressed, Hopeless 0 0 0 0 0  PHQ - 2 Score 1 0 0 0 0  Altered sleeping 0 0 0 0 0  Tired, decreased energy 0 0 0 0 0  Change in appetite 0 0 0 0 0  Feeling bad or failure about yourself  0 0 0 0 0  Trouble concentrating 0 0 0 0 0  Moving slowly or fidgety/restless 0 0 0 0 0  Suicidal thoughts 0 0 0 0 0  PHQ-9 Score 1 0 0 0 0  Difficult doing work/chores Not difficult at all Not difficult at all Not difficult at all Not difficult at all Not difficult at all   Hx of genital HSV: -Currently on Valacyclovir 500 mg as needed - usually has flares about once a month but hasn't had a flare since February 2023  Review of Systems  Constitutional:  Negative for chills and  fever.  Respiratory:  Negative for shortness of breath.   Cardiovascular:  Negative for chest pain.  Gastrointestinal:  Negative for abdominal pain, heartburn and nausea.  Musculoskeletal:  Positive for joint pain.  Neurological:  Positive for headaches.  Psychiatric/Behavioral:  Negative for depression. The patient has insomnia.       Objective:     BP 126/84   Pulse 100   Temp 98 F (36.7 C)   Resp 16   Ht 5' 2.5" (1.588 m)   Wt 231 lb 4.8 oz (104.9 kg)   SpO2 98%   BMI 41.63 kg/m  BP Readings from Last 3 Encounters:  08/02/22 126/84  04/12/22 122/68  03/18/22 126/85   Wt Readings from Last 3 Encounters:  08/02/22 231 lb 4.8 oz (104.9 kg)  04/12/22 216 lb (98 kg)  03/18/22 209 lb 2 oz (94.9 kg)      Physical Exam Constitutional:      Appearance: Normal appearance. She is well-developed.  HENT:     Head: Normocephalic and atraumatic.  Eyes:     Conjunctiva/sclera: Conjunctivae normal.  Cardiovascular:     Rate and Rhythm: Normal rate and regular rhythm.  Pulmonary:  Effort: Pulmonary effort is normal.     Breath sounds: Normal breath sounds.  Skin:    General: Skin is warm and dry.  Neurological:     General: No focal deficit present.     Mental Status: She is alert. Mental status is at baseline.  Psychiatric:        Mood and Affect: Mood normal.        Behavior: Behavior normal.     No results found for any visits on 08/02/22.  Last CBC Lab Results  Component Value Date   WBC 8.4 08/14/2021   HGB 14.0 08/14/2021   HCT 41.0 08/14/2021   MCV 87.8 08/14/2021   MCH 30.0 08/14/2021   RDW 13.1 08/14/2021   PLT 231 08/14/2021   Last metabolic panel Lab Results  Component Value Date   GLUCOSE 93 11/06/2021   NA 137 11/06/2021   K 3.8 11/06/2021   CL 105 11/06/2021   CO2 24 11/06/2021   BUN 6 (L) 11/06/2021   CREATININE 0.65 11/06/2021   EGFR 118 11/06/2021   CALCIUM 9.3 11/06/2021   PROT 6.7 11/06/2021   ALBUMIN 4.4 06/08/2015    BILITOT 0.6 11/06/2021   ALKPHOS 45 06/08/2015   AST 18 11/06/2021   ALT 26 11/06/2021   ANIONGAP 5 07/03/2015   Last lipids Lab Results  Component Value Date   CHOL 158 08/14/2021   HDL 42 (L) 08/14/2021   LDLCALC 82 08/14/2021   TRIG 242 (H) 08/14/2021   CHOLHDL 3.8 08/14/2021   Last hemoglobin A1c Lab Results  Component Value Date   HGBA1C 5.5 08/14/2021   Last thyroid functions Lab Results  Component Value Date   TSH 3.52 08/14/2021   Last vitamin D Lab Results  Component Value Date   VD25OH 25.22 (L) 08/17/2015   Last vitamin B12 and Folate Lab Results  Component Value Date   VITAMINB12 203 (L) 08/17/2015   FOLATE >23.3 08/17/2015      The ASCVD Risk score (Arnett DK, et al., 2019) failed to calculate for the following reasons:   The 2019 ASCVD risk score is only valid for ages 79 to 23    Assessment & Plan:   1. Moderate episode of recurrent major depressive disorder (HCC): Stable, well-controlled.  Continue Zoloft 100 mg daily, refilled.  Patient is due for labs including CBC and CMP.  - CBC w/Diff/Platelet - COMPLETE METABOLIC PANEL WITH GFR - sertraline (ZOLOFT) 100 MG tablet; Take 1 tablet (100 mg total) by mouth daily.  Dispense: 90 tablet; Refill: 1  2. Migraine with aura and without status migrainosus, not intractable: Restart Imitrex 50 mg as needed.  Will follow-up in 3 months to recheck.  - SUMAtriptan (IMITREX) 50 MG tablet; Take 1 tablet (50 mg total) by mouth every 2 (two) hours as needed for migraine. May repeat in 2 hours if headache persists or recurs.  Dispense: 9 tablet; Refill: 0  3. Fatigue, unspecified type/Pain in joint, multiple sites: Uncertain if this is due to starting nursing school but will rule out thyroid disease, screen for autoimmune disease and check vitamin D today.  - TSH - Vitamin D (25 hydroxy) - Antinuclear Antib (ANA)  4. Weight gain/Hyperglycemia: Recheck A1c.  - HgB A1c - TSH  5. Lipid screening:  Cholesterol screening with above labs today.  - Lipid Profile   Return in about 3 months (around 11/01/2022).    Margarita Mail, DO

## 2022-08-02 ENCOUNTER — Other Ambulatory Visit: Payer: Self-pay

## 2022-08-02 ENCOUNTER — Ambulatory Visit (INDEPENDENT_AMBULATORY_CARE_PROVIDER_SITE_OTHER): Payer: 59 | Admitting: Internal Medicine

## 2022-08-02 ENCOUNTER — Encounter: Payer: Self-pay | Admitting: Internal Medicine

## 2022-08-02 VITALS — BP 126/84 | HR 100 | Temp 98.0°F | Resp 16 | Ht 62.5 in | Wt 231.3 lb

## 2022-08-02 DIAGNOSIS — F331 Major depressive disorder, recurrent, moderate: Secondary | ICD-10-CM | POA: Diagnosis not present

## 2022-08-02 DIAGNOSIS — Z13220 Encounter for screening for lipoid disorders: Secondary | ICD-10-CM | POA: Diagnosis not present

## 2022-08-02 DIAGNOSIS — R5383 Other fatigue: Secondary | ICD-10-CM | POA: Diagnosis not present

## 2022-08-02 DIAGNOSIS — R768 Other specified abnormal immunological findings in serum: Secondary | ICD-10-CM

## 2022-08-02 DIAGNOSIS — M2550 Pain in unspecified joint: Secondary | ICD-10-CM

## 2022-08-02 DIAGNOSIS — G43109 Migraine with aura, not intractable, without status migrainosus: Secondary | ICD-10-CM | POA: Diagnosis not present

## 2022-08-02 DIAGNOSIS — E559 Vitamin D deficiency, unspecified: Secondary | ICD-10-CM | POA: Diagnosis not present

## 2022-08-02 DIAGNOSIS — R635 Abnormal weight gain: Secondary | ICD-10-CM | POA: Diagnosis not present

## 2022-08-02 DIAGNOSIS — R739 Hyperglycemia, unspecified: Secondary | ICD-10-CM

## 2022-08-02 MED ORDER — SERTRALINE HCL 100 MG PO TABS
100.0000 mg | ORAL_TABLET | Freq: Every day | ORAL | 1 refills | Status: DC
Start: 2022-08-02 — End: 2023-01-29
  Filled 2022-08-02 – 2022-08-21 (×2): qty 90, 90d supply, fill #0
  Filled 2022-12-11: qty 90, 90d supply, fill #1

## 2022-08-02 MED ORDER — SUMATRIPTAN SUCCINATE 50 MG PO TABS
50.0000 mg | ORAL_TABLET | ORAL | 0 refills | Status: DC | PRN
Start: 2022-08-02 — End: 2022-11-05
  Filled 2022-08-02 – 2022-08-21 (×2): qty 9, 15d supply, fill #0

## 2022-08-02 NOTE — Patient Instructions (Signed)
It was great seeing you today!  Plan discussed at today's visit: -Blood work ordered today, results will be uploaded to MyChart.  -Zoloft refilled -Start back on Imitrex 50 mg as needed for migraine - can take a second dose within 2 hours if symptoms persist, do not take more than 2 doses within 24 hours  Follow up in: 3 months   Take care and let us know if you have any questions or concerns prior to your next visit.  Dr. Caralee Ates

## 2022-08-03 LAB — LIPID PANEL: Triglycerides: 413 mg/dL — ABNORMAL HIGH (ref <150)

## 2022-08-03 LAB — VITAMIN D 25 HYDROXY (VIT D DEFICIENCY, FRACTURES): Vit D, 25-Hydroxy: 33 ng/mL (ref 30–100)

## 2022-08-03 LAB — HEMOGLOBIN A1C
Mean Plasma Glucose: 114 mg/dL
eAG (mmol/L): 6.3 mmol/L

## 2022-08-05 ENCOUNTER — Encounter: Payer: Self-pay | Admitting: Internal Medicine

## 2022-08-05 LAB — COMPLETE METABOLIC PANEL WITH GFR
AG Ratio: 1.6 (calc) (ref 1.0–2.5)
ALT: 27 U/L (ref 6–29)
AST: 21 U/L (ref 10–30)
Albumin: 4.3 g/dL (ref 3.6–5.1)
Alkaline phosphatase (APISO): 47 U/L (ref 31–125)
BUN: 7 mg/dL (ref 7–25)
CO2: 25 mmol/L (ref 20–32)
Calcium: 9.3 mg/dL (ref 8.6–10.2)
Chloride: 103 mmol/L (ref 98–110)
Creat: 0.59 mg/dL (ref 0.50–0.97)
Globulin: 2.7 g/dL (calc) (ref 1.9–3.7)
Glucose, Bld: 97 mg/dL (ref 65–99)
Potassium: 3.6 mmol/L (ref 3.5–5.3)
Sodium: 141 mmol/L (ref 135–146)
Total Bilirubin: 0.4 mg/dL (ref 0.2–1.2)
Total Protein: 7 g/dL (ref 6.1–8.1)
eGFR: 120 mL/min/{1.73_m2} (ref >=60)

## 2022-08-05 LAB — LIPID PANEL
Cholesterol: 185 mg/dL (ref <200)
HDL: 44 mg/dL — ABNORMAL LOW (ref >=50)
Non-HDL Cholesterol (Calc): 141 mg/dL (calc) — ABNORMAL HIGH (ref <130)
Total CHOL/HDL Ratio: 4.2 (calc) (ref <5.0)

## 2022-08-05 LAB — CBC WITH DIFFERENTIAL/PLATELET
Absolute Monocytes: 570 cells/uL (ref 200–950)
Basophils Absolute: 17 cells/uL (ref 0–200)
Basophils Relative: 0.2 %
Eosinophils Absolute: 0 cells/uL — ABNORMAL LOW (ref 15–500)
Eosinophils Relative: 0 %
HCT: 42.9 % (ref 35.0–45.0)
Hemoglobin: 14.9 g/dL (ref 11.7–15.5)
Lymphs Abs: 2916 cells/uL (ref 850–3900)
MCH: 30.1 pg (ref 27.0–33.0)
MCHC: 34.7 g/dL (ref 32.0–36.0)
MCV: 86.7 fL (ref 80.0–100.0)
MPV: 9.5 fL (ref 7.5–12.5)
Monocytes Relative: 6.7 %
Neutro Abs: 4998 cells/uL (ref 1500–7800)
Neutrophils Relative %: 58.8 %
Platelets: 301 10*3/uL (ref 140–400)
RBC: 4.95 10*6/uL (ref 3.80–5.10)
RDW: 13.1 % (ref 11.0–15.0)
Total Lymphocyte: 34.3 %
WBC: 8.5 10*3/uL (ref 3.8–10.8)

## 2022-08-05 LAB — TSH: TSH: 1.86 mIU/L

## 2022-08-05 LAB — ANTI-NUCLEAR AB-TITER (ANA TITER): ANA Titer 1: 1:80 {titer} — ABNORMAL HIGH

## 2022-08-05 LAB — HEMOGLOBIN A1C: Hgb A1c MFr Bld: 5.6 % of total Hgb (ref <5.7)

## 2022-08-05 LAB — ANA: Anti Nuclear Antibody (ANA): POSITIVE — AB

## 2022-08-08 ENCOUNTER — Telehealth: Payer: Self-pay | Admitting: Internal Medicine

## 2022-08-08 NOTE — Telephone Encounter (Signed)
Patient called in regards to lab results. Results given. The results for ANA test are in, but have not been reviewed by provider, unable to release those results to patient. Patient states she is most concerned about the ANA results, please advise patient. Patient also states that she will try the fish oil OTC instead of medication to help decrease cholesterol.

## 2022-08-09 NOTE — Addendum Note (Signed)
Addended by: Margarita Mail on: 08/09/2022 09:22 AM   Modules accepted: Orders

## 2022-08-16 ENCOUNTER — Other Ambulatory Visit: Payer: Self-pay

## 2022-08-21 ENCOUNTER — Other Ambulatory Visit: Payer: Self-pay

## 2022-08-29 ENCOUNTER — Other Ambulatory Visit: Payer: Self-pay

## 2022-08-29 ENCOUNTER — Emergency Department (HOSPITAL_COMMUNITY): Payer: 59

## 2022-08-29 ENCOUNTER — Encounter (HOSPITAL_COMMUNITY): Payer: Self-pay | Admitting: Emergency Medicine

## 2022-08-29 ENCOUNTER — Emergency Department (HOSPITAL_COMMUNITY)
Admission: EM | Admit: 2022-08-29 | Discharge: 2022-08-29 | Disposition: A | Discharge status: Home / Self Care | Payer: 59 | Attending: Emergency Medicine | Admitting: Emergency Medicine

## 2022-08-29 DIAGNOSIS — Z91040 Latex allergy status: Secondary | ICD-10-CM | POA: Insufficient documentation

## 2022-08-29 DIAGNOSIS — R079 Chest pain, unspecified: Secondary | ICD-10-CM | POA: Diagnosis not present

## 2022-08-29 DIAGNOSIS — R0789 Other chest pain: Secondary | ICD-10-CM | POA: Diagnosis not present

## 2022-08-29 LAB — CBC
HCT: 42 % (ref 36.0–46.0)
Hemoglobin: 14.5 g/dL (ref 12.0–15.0)
MCH: 29.7 pg (ref 26.0–34.0)
MCHC: 34.5 g/dL (ref 30.0–36.0)
MCV: 86.1 fL (ref 80.0–100.0)
Platelets: 289 10*3/uL (ref 150–400)
RBC: 4.88 MIL/uL (ref 3.87–5.11)
RDW: 12.3 % (ref 11.5–15.5)
WBC: 11 10*3/uL — ABNORMAL HIGH (ref 4.0–10.5)
nRBC: 0 % (ref 0.0–0.2)

## 2022-08-29 LAB — BASIC METABOLIC PANEL
Anion gap: 11 (ref 5–15)
BUN: 9 mg/dL (ref 6–20)
CO2: 24 mmol/L (ref 22–32)
Calcium: 9.5 mg/dL (ref 8.9–10.3)
Chloride: 102 mmol/L (ref 98–111)
Creatinine, Ser: 0.76 mg/dL (ref 0.44–1.00)
GFR, Estimated: >60 mL/min (ref >60)
Glucose, Bld: 119 mg/dL — ABNORMAL HIGH (ref 70–99)
Potassium: 3.1 mmol/L — ABNORMAL LOW (ref 3.5–5.1)
Sodium: 137 mmol/L (ref 135–145)

## 2022-08-29 LAB — HEPATIC FUNCTION PANEL
ALT: 28 U/L (ref 0–44)
AST: 23 U/L (ref 15–41)
Albumin: 3.7 g/dL (ref 3.5–5.0)
Alkaline Phosphatase: 42 U/L (ref 38–126)
Bilirubin, Direct: 0.1 mg/dL (ref 0.0–0.2)
Indirect Bilirubin: 0.5 mg/dL (ref 0.3–0.9)
Total Bilirubin: 0.6 mg/dL (ref 0.3–1.2)
Total Protein: 6.6 g/dL (ref 6.5–8.1)

## 2022-08-29 LAB — TROPONIN I (HIGH SENSITIVITY)
Troponin I (High Sensitivity): <2 ng/L (ref <18)
Troponin I (High Sensitivity): 3 ng/L (ref <18)

## 2022-08-29 LAB — LIPASE, BLOOD: Lipase: 35 U/L (ref 11–51)

## 2022-08-29 LAB — I-STAT BETA HCG BLOOD, ED (MC, WL, AP ONLY): I-stat hCG, quantitative: <5 m[IU]/mL (ref <5)

## 2022-08-29 LAB — D-DIMER, QUANTITATIVE: D-Dimer, Quant: <0.27 ug/mL-FEU (ref 0.00–0.50)

## 2022-08-29 MED ORDER — KETOROLAC TROMETHAMINE 30 MG/ML IJ SOLN
30.0000 mg | Freq: Once | INTRAMUSCULAR | Status: AC
  Administered 2022-08-29: 30 mg via INTRAVENOUS
  Filled 2022-08-29: qty 1

## 2022-08-29 NOTE — Discharge Instructions (Signed)
You were seen today for chest pain.  Your workup was overall reassuring.  Trial naproxen or ibuprofen for the next 2 to 3 days to see if this helps.

## 2022-08-29 NOTE — ED Provider Notes (Signed)
April Buck   CSN: [id number] Arrival date & time: 08/29/22  0142     History  Chief Complaint  Patient presents with   Chest Pain   Shoulder Pain    April Buck is a 37 y.o. female.  HPI     This is a 37 year old female who presents with chest pain.  Reports the chest pain started yesterday afternoon.  She describes pain that now radiated from the left side of her chest into her left arm.  She had some paresthesias in the left hand.  She also now some radiation into the jaw and reports discomfort between her shoulder blades.  She states she is having a hard time getting comfortable.  She thought it was related to gas but has not been able to relieve it.  She recently was diagnosed with hyperlipidemia and this concerned her.  She does have a family history of heart disease.  No history of diabetes, hypertension.  Home Medications Prior to Admission medications   Medication Sig Start Date End Date Taking? Authorizing Provider  sertraline (ZOLOFT) 100 MG tablet Take 1 tablet (100 mg total) by mouth daily. 08/02/22   Margarita Mail, DO  SUMAtriptan (IMITREX) 50 MG tablet Take 1 tablet (50 mg total) by mouth every 2 (two) hours as needed for migraine. May repeat in 2 hours if headache persists or recurs. 08/02/22   Margarita Mail, DO  tiZANidine (ZANAFLEX) 4 MG tablet Take 1 tablet (4 mg total) by mouth at bedtime as needed for muscle spasms. 04/12/22   Margarita Mail, DO  valACYclovir (VALTREX) 500 MG tablet Take 1 tablet (500 mg total) by mouth daily. 01/31/22   Margarita Mail, DO      Allergies    Latex    Review of Systems   Review of Systems  Constitutional:  Negative for fever.  Respiratory:  Negative for cough and shortness of breath.   Cardiovascular:  Positive for chest pain. Negative for leg swelling.  All other systems reviewed and are negative.   Physical Exam Updated Vital Signs BP 110/64    Pulse 86   Temp (!) 97.5 F (36.4 C) (Oral)   Resp (!) 24   Ht 1.575 m (5\' 2" )   Wt 104.3 kg   LMP 07/21/2022   SpO2 99%   BMI 42.06 kg/m  Physical Exam Vitals and nursing Buck reviewed.  Constitutional:      Appearance: She is well-developed. She is obese. She is not ill-appearing.  HENT:     Head: Normocephalic and atraumatic.  Eyes:     Pupils: Pupils are equal, round, and reactive to light.  Cardiovascular:     Rate and Rhythm: Normal rate and regular rhythm.     Heart sounds: Normal heart sounds.  Pulmonary:     Effort: Pulmonary effort is normal. No respiratory distress.     Breath sounds: Normal breath sounds. No wheezing.  Abdominal:     General: Bowel sounds are normal.     Palpations: Abdomen is soft.  Musculoskeletal:     Cervical back: Neck supple.  Skin:    General: Skin is warm and dry.  Neurological:     Mental Status: She is alert and oriented to person, place, and time.  Psychiatric:        Mood and Affect: Mood normal.     ED Results / Procedures / Treatments   Labs (all labs ordered are listed, but only abnormal  results are displayed) Labs Reviewed  BASIC METABOLIC PANEL - Abnormal; Notable for the following components:      Result Value   Potassium 3.1 (*)    Glucose, Bld 119 (*)    All other components within normal limits  CBC - Abnormal; Notable for the following components:   WBC 11.0 (*)    All other components within normal limits  LIPASE, BLOOD  HEPATIC FUNCTION PANEL  D-DIMER, QUANTITATIVE  I-STAT BETA HCG BLOOD, ED ([facility], [facility], [facility] ONLY)  TROPONIN I (HIGH SENSITIVITY)  TROPONIN I (HIGH SENSITIVITY)    EKG EKG Interpretation  Date/Time:  Thursday Aug 29 2022 01:53:46 EDT Ventricular Rate:  93 PR Interval:  142 QRS Duration: 86 QT Interval:  352 QTC Calculation: 437 R Axis:   70 Text Interpretation: Normal sinus rhythm Normal ECG When compared with ECG of 03-Jul-2015 21:55, PREVIOUS ECG IS PRESENT Confirmed by Ross Marcus ([id number]) on 08/29/2022 3:56:02 AM  Radiology DG Chest 2 View  Result Date: 08/29/2022 CLINICAL DATA:  Chest pain EXAM: CHEST - 2 VIEW COMPARISON:  02/16/2018 FINDINGS: The heart size and mediastinal contours are within normal limits. Both lungs are clear. The visualized skeletal structures are unremarkable. IMPRESSION: No active cardiopulmonary disease. Electronically Signed   By: Deatra Robinson M.D.   On: 08/29/2022 02:08    Procedures Procedures    Medications Ordered in ED Medications  ketorolac (TORADOL) 30 MG/ML injection 30 mg (30 mg Intravenous Given 08/29/22 0414)    ED Course/ Medical Decision Making/ A&P                             Medical Decision Making Amount and/or Complexity of Data Reviewed Labs: ordered. Radiology: ordered.  Risk Prescription drug management.   This patient presents to the ED for concern of chest pain, this involves an extensive number of treatment options, and is a complaint that carries with it a high risk of complications and morbidity.  I considered the following differential and admission for this acute, potentially life threatening condition.  The differential diagnosis includes ACS, PE, pneumothorax, pneumonia, chest wall pain, GI etiology such as reflux  MDM:    This is a 37 year old female who presents with chest pain.  She is overall nontoxic and vital signs are reassuring.  She is fairly young for ACS but has risk factors of obesity and hyperlipidemia.  EKG shows no evidence of acute ischemia or arrhythmia.  Screening D-dimer was sent and is a negative.  Doubt PE.  Basic lab work is overall reassuring including troponin x 2 which effectively rules out ACS in this low risk patient.  Chest x-ray without pneumothorax or pneumonia.  Patient had some improvement with Toradol.  Recommend a trial of anti-inflammatories to see if this helps.  (Labs, imaging, consults)  Labs: I Ordered, and personally interpreted labs.  The pertinent  results include: CBC, BMP, hepatic function, lipase, troponin x 2  Imaging Studies ordered: I ordered imaging studies including chest x-ray I independently visualized and interpreted imaging. I agree with the radiologist interpretation  Additional history obtained from chart review.  External records from outside source obtained and reviewed including prior evaluations  Cardiac Monitoring: The patient was maintained on a cardiac monitor.  If on the cardiac monitor, I personally viewed and interpreted the cardiac monitored which showed an underlying rhythm of: Sinus rhythm  Reevaluation: After the interventions noted above, I reevaluated the patient and found that they  have :improved  Social Determinants of Health:  lives independently  Disposition: Discharge  Co morbidities that complicate the patient evaluation  Past Medical History:  Diagnosis Date   Depression    Frequent headaches    GERD (gastroesophageal reflux disease)    Herpes    Migraines    PCOS (polycystic ovarian syndrome)    Seasonal allergies      Medicines Meds ordered this encounter  Medications   ketorolac (TORADOL) 30 MG/ML injection 30 mg    I have reviewed the patients home medicines and have made adjustments as needed  Problem List / ED Course: Problem List Items Addressed This Visit   None Visit Diagnoses     Atypical chest pain    -  Primary                   Final Clinical Impression(s) / ED Diagnoses Final diagnoses:  Atypical chest pain    Rx / DC Orders ED Discharge Orders     None         Shon Baton, MD 08/29/22 [phone]

## 2022-08-29 NOTE — ED Triage Notes (Signed)
Pt in with "gas-like" pain to L chest that began around 4pm, now has pain between shoulders, and numbness to L arm. Took 1 baby aspirin PTA. Pain currently 9/10, +nauseous and pain with deep breaths

## 2022-09-25 ENCOUNTER — Other Ambulatory Visit: Payer: Self-pay

## 2022-09-25 DIAGNOSIS — R21 Rash and other nonspecific skin eruption: Secondary | ICD-10-CM | POA: Diagnosis not present

## 2022-09-25 DIAGNOSIS — M2550 Pain in unspecified joint: Secondary | ICD-10-CM | POA: Diagnosis not present

## 2022-09-25 DIAGNOSIS — E538 Deficiency of other specified B group vitamins: Secondary | ICD-10-CM | POA: Diagnosis not present

## 2022-09-25 DIAGNOSIS — M130 Polyarthritis, unspecified: Secondary | ICD-10-CM | POA: Diagnosis not present

## 2022-09-25 DIAGNOSIS — M791 Myalgia, unspecified site: Secondary | ICD-10-CM | POA: Diagnosis not present

## 2022-09-25 MED ORDER — MELOXICAM 7.5 MG PO TABS
7.5000 mg | ORAL_TABLET | Freq: Every day | ORAL | 2 refills | Status: DC | PRN
  Filled 2022-09-25: qty 60, 30d supply, fill #0

## 2022-09-26 ENCOUNTER — Telehealth: Payer: Self-pay | Admitting: Internal Medicine

## 2022-09-26 NOTE — Telephone Encounter (Signed)
Copied from CRM [id number]. Topic: General - Other >> Sep 26, 2022  3:37 PM Lennox Pippins wrote: Patient called and stated Dr. Caralee Ates started her on 1000U of Vitamin D as her Vitamin D levels were low, she has been taking a multi vitamin along with the 1000U of Vitamin D but patient was just seen at the rheumatologist and her vitamin D was even lower than it was when she saw Dr. Caralee Ates. Patient would like a call back regarding this & was wondering if she needs to up her Vitamin D intake.  Patients callback #: [phone]

## 2022-09-27 NOTE — Telephone Encounter (Signed)
VM full she can take 2000 vitamin d daily

## 2022-10-01 ENCOUNTER — Encounter: Payer: Self-pay | Admitting: Internal Medicine

## 2022-10-08 ENCOUNTER — Encounter: Payer: Self-pay | Admitting: Internal Medicine

## 2022-10-11 ENCOUNTER — Ambulatory Visit: Payer: 59

## 2022-10-11 DIAGNOSIS — K921 Melena: Secondary | ICD-10-CM | POA: Diagnosis not present

## 2022-10-16 LAB — POC HEMOCCULT BLD/STL (HOME/3-CARD/SCREEN)
Card #1 Date: 7052024
Card #2 Date: 7042024
Card #2 Fecal Occult Blod, POC: NEGATIVE
Card #3 Date: 7032024
Card #3 Fecal Occult Blood, POC: NEGATIVE
Fecal Occult Blood, POC: NEGATIVE

## 2022-10-17 ENCOUNTER — Other Ambulatory Visit: Payer: Self-pay | Admitting: Oncology

## 2022-10-17 DIAGNOSIS — Z006 Encounter for examination for normal comparison and control in clinical research program: Secondary | ICD-10-CM

## 2022-10-22 ENCOUNTER — Telehealth: Payer: 59 | Admitting: Family Medicine

## 2022-10-22 ENCOUNTER — Other Ambulatory Visit: Payer: Self-pay

## 2022-10-22 DIAGNOSIS — H1032 Unspecified acute conjunctivitis, left eye: Secondary | ICD-10-CM

## 2022-10-22 MED ORDER — POLYMYXIN B-TRIMETHOPRIM 10000-0.1 UNIT/ML-% OP SOLN
1.0000 [drp] | Freq: Four times a day (QID) | OPHTHALMIC | 0 refills | Status: DC
Start: 2022-10-22 — End: 2022-11-05
  Filled 2022-10-22: qty 10, 50d supply, fill #0

## 2022-10-22 NOTE — Progress Notes (Signed)
E-Visit for Pink Eye ? ? ?We are sorry that you are not feeling well.  Here is how we plan to help! ? ?Based on what you have shared with me it looks like you have conjunctivitis.  Conjunctivitis is a common inflammatory or infectious condition of the eye that is often referred to as "pink eye".  In most cases it is contagious (viral or bacterial). However, not all conjunctivitis requires antibiotics (ex. Allergic).  We have made appropriate suggestions for you based upon your presentation. ? ?I have prescribed Polytrim Ophthalmic drops 1-2 drops 4 times a day times 5 days ? ?Pink eye can be highly contagious.  It is typically spread through direct contact with secretions, or contaminated objects or surfaces that one may have touched.  Strict handwashing is suggested with soap and water is urged.  If not available, use alcohol based had sanitizer.  Avoid unnecessary touching of the eye.  If you wear contact lenses, you will need to refrain from wearing them until you see no white discharge from the eye for at least 24 hours after being on medication.  You should see symptom improvement in 1-2 days after starting the medication regimen.  Call us if symptoms are not improved in 1-2 days. ? ?Home Care: ?Wash your hands often! ?Do not wear your contacts until you complete your treatment plan. ?Avoid sharing towels, bed linen, personal items with a person who has pink eye. ?See attention for anyone in your home with similar symptoms. ? ?Get Help Right Away If: ?Your symptoms do not improve. ?You develop blurred or loss of vision. ?Your symptoms worsen (increased discharge, pain or redness) ? ? ?Thank you for choosing an e-visit. ? ?Your e-visit answers were reviewed by a board certified advanced clinical practitioner to complete your personal care plan. Depending upon the condition, your plan could have included both over the counter or prescription medications. ? ?Please review your pharmacy choice. Make sure the  pharmacy is open so you can pick up prescription now. If there is a problem, you may contact your provider through MyChart messaging and have the prescription routed to another pharmacy.  Your safety is important to us. If you have drug allergies check your prescription carefully.  ? ?For the next 24 hours you can use MyChart to ask questions about today's visit, request a non-urgent call back, or ask for a work or school excuse. ?You will get an email in the next two days asking about your experience. I hope that your e-visit has been valuable and will speed your recovery. ? ?I provided 5 minutes of non face-to-face time during this encounter for chart review, medication and order placement, as well as and documentation.  ? ?

## 2022-11-04 ENCOUNTER — Ambulatory Visit: Payer: 59 | Admitting: Internal Medicine

## 2022-11-04 NOTE — Progress Notes (Unsigned)
Established Patient Office Visit  Subjective   Patient ID: April Buck, female    DOB: June 15, 1985  Age: 37 y.o. MRN: [id number]  No chief complaint on file.   Patient is here for follow up on chronic medical conditions.  Since her last office visit, the patient states that she has been experiencing worsening fatigue and joint pains.  She states she has pain in her shoulders, elbows, hips and knees.  Pain seems to be worse at night and affecting her sleep but does occur during the day as well.  Of note patient recently started nursing school.  She also started having migraines again.  So far she has had 2 migraines in the last month.  She currently is not on treatment for migraines.  She had been on an injection in the past for this which she did not like and oral medication as well.  MDD: -Mood status: stable -Current treatment: Zoloft 100 mg -Satisfied with current treatment?: yes -Symptom severity: severe  -Duration of current treatment : months -Side effects: no Medication compliance: excellent compliance     08/02/2022   11:15 AM 04/12/2022    8:07 AM 04/12/2022    8:04 AM 02/25/2022    1:04 PM 01/31/2022    2:12 PM  Depression screen PHQ 2/9  Decreased Interest 1 0 0 0 0  Down, Depressed, Hopeless 0 0 0 0 0  PHQ - 2 Score 1 0 0 0 0  Altered sleeping 0 0 0 0 0  Tired, decreased energy 0 0 0 0 0  Change in appetite 0 0 0 0 0  Feeling bad or failure about yourself  0 0 0 0 0  Trouble concentrating 0 0 0 0 0  Moving slowly or fidgety/restless 0 0 0 0 0  Suicidal thoughts 0 0 0 0 0  PHQ-9 Score 1 0 0 0 0  Difficult doing work/chores Not difficult at all Not difficult at all Not difficult at all Not difficult at all Not difficult at all   Hx of genital HSV: -Currently on Valacyclovir 500 mg as needed - usually has flares about once a month but hasn't had a flare since February 2023  Review of Systems  Constitutional:  Negative for chills and fever.  Respiratory:  Negative  for shortness of breath.   Cardiovascular:  Negative for chest pain.  Gastrointestinal:  Negative for abdominal pain, heartburn and nausea.  Musculoskeletal:  Positive for joint pain.  Neurological:  Positive for headaches.  Psychiatric/Behavioral:  Negative for depression. The patient has insomnia.       Objective:     There were no vitals taken for this visit. BP Readings from Last 3 Encounters:  08/29/22 111/67  08/02/22 126/84  04/12/22 122/68   Wt Readings from Last 3 Encounters:  08/29/22 229 lb 15 oz (104.3 kg)  08/02/22 231 lb 4.8 oz (104.9 kg)  04/12/22 216 lb (98 kg)      Physical Exam Constitutional:      Appearance: Normal appearance. She is well-developed.  HENT:     Head: Normocephalic and atraumatic.  Eyes:     Conjunctiva/sclera: Conjunctivae normal.  Cardiovascular:     Rate and Rhythm: Normal rate and regular rhythm.  Pulmonary:     Effort: Pulmonary effort is normal.     Breath sounds: Normal breath sounds.  Skin:    General: Skin is warm and dry.  Neurological:     General: No focal deficit present.  Mental Status: She is alert. Mental status is at baseline.  Psychiatric:        Mood and Affect: Mood normal.        Behavior: Behavior normal.     No results found for any visits on 11/05/22.  Last CBC Lab Results  Component Value Date   WBC 11.0 (H) 08/29/2022   HGB 14.5 08/29/2022   HCT 42.0 08/29/2022   MCV 86.1 08/29/2022   MCH 29.7 08/29/2022   RDW 12.3 08/29/2022   PLT 289 08/29/2022   Last metabolic panel Lab Results  Component Value Date   GLUCOSE 119 (H) 08/29/2022   NA 137 08/29/2022   K 3.1 (L) 08/29/2022   CL 102 08/29/2022   CO2 24 08/29/2022   BUN 9 08/29/2022   CREATININE 0.76 08/29/2022   EGFR 120 08/02/2022   CALCIUM 9.5 08/29/2022   PROT 6.6 08/29/2022   ALBUMIN 3.7 08/29/2022   BILITOT 0.6 08/29/2022   ALKPHOS 42 08/29/2022   AST 23 08/29/2022   ALT 28 08/29/2022   ANIONGAP 11 08/29/2022   Last  lipids Lab Results  Component Value Date   CHOL 185 08/02/2022   HDL 44 (L) 08/02/2022   LDLCALC  08/02/2022     Comment:     . LDL cholesterol not calculated. Triglyceride levels greater than 400 mg/dL invalidate calculated LDL results. . Reference range: <100 . Desirable range <100 mg/dL for primary prevention;   <70 mg/dL for patients with CHD or diabetic patients  with > or = 2 CHD risk factors. Marland Kitchen LDL-C is now calculated using the Martin-Hopkins  calculation, which is a validated novel method providing  better accuracy than the Friedewald equation in the  estimation of LDL-C.  Horald Pollen et al. Lenox Ahr. 4401;027(25): 2061-2068  (http://education.[facility].com/faq/FAQ164)    TRIG 413 (H) 08/02/2022   CHOLHDL 4.2 08/02/2022   Last hemoglobin A1c Lab Results  Component Value Date   HGBA1C 5.6 08/02/2022   Last thyroid functions Lab Results  Component Value Date   TSH 1.86 08/02/2022   Last vitamin D Lab Results  Component Value Date   VD25OH 33 08/02/2022   Last vitamin B12 and Folate Lab Results  Component Value Date   VITAMINB12 203 (L) 08/17/2015   FOLATE >23.3 08/17/2015      The ASCVD Risk score (Arnett DK, et al., 2019) failed to calculate for the following reasons:   The 2019 ASCVD risk score is only valid for ages 32 to 21    Assessment & Plan:   1. Moderate episode of recurrent major depressive disorder (HCC): Stable, well-controlled.  Continue Zoloft 100 mg daily, refilled.  Patient is due for labs including CBC and CMP.  - CBC w/Diff/Platelet - COMPLETE METABOLIC PANEL WITH GFR - sertraline (ZOLOFT) 100 MG tablet; Take 1 tablet (100 mg total) by mouth daily.  Dispense: 90 tablet; Refill: 1  2. Migraine with aura and without status migrainosus, not intractable: Restart Imitrex 50 mg as needed.  Will follow-up in 3 months to recheck.  - SUMAtriptan (IMITREX) 50 MG tablet; Take 1 tablet (50 mg total) by mouth every 2 (two) hours as needed  for migraine. May repeat in 2 hours if headache persists or recurs.  Dispense: 9 tablet; Refill: 0  3. Fatigue, unspecified type/Pain in joint, multiple sites: Uncertain if this is due to starting nursing school but will rule out thyroid disease, screen for autoimmune disease and check vitamin D today.  - TSH - Vitamin D (25 hydroxy) -  Antinuclear Antib (ANA)  4. Weight gain/Hyperglycemia: Recheck A1c.  - HgB A1c - TSH  5. Lipid screening: Cholesterol screening with above labs today.  - Lipid Profile   No follow-ups on file.    Margarita Mail, DO

## 2022-11-05 ENCOUNTER — Encounter: Payer: Self-pay | Admitting: Internal Medicine

## 2022-11-05 ENCOUNTER — Other Ambulatory Visit: Payer: Self-pay

## 2022-11-05 ENCOUNTER — Ambulatory Visit (INDEPENDENT_AMBULATORY_CARE_PROVIDER_SITE_OTHER): Payer: 59 | Admitting: Internal Medicine

## 2022-11-05 VITALS — BP 106/68 | HR 98 | Temp 98.0°F | Resp 16 | Ht 62.5 in | Wt 238.2 lb

## 2022-11-05 DIAGNOSIS — G43109 Migraine with aura, not intractable, without status migrainosus: Secondary | ICD-10-CM

## 2022-11-05 DIAGNOSIS — M2550 Pain in unspecified joint: Secondary | ICD-10-CM | POA: Diagnosis not present

## 2022-11-05 DIAGNOSIS — F331 Major depressive disorder, recurrent, moderate: Secondary | ICD-10-CM | POA: Diagnosis not present

## 2022-11-05 DIAGNOSIS — E559 Vitamin D deficiency, unspecified: Secondary | ICD-10-CM | POA: Diagnosis not present

## 2022-11-05 MED ORDER — FAMOTIDINE 40 MG PO TABS
40.0000 mg | ORAL_TABLET | Freq: Every day | ORAL | 1 refills | Status: DC
  Filled 2022-11-05: qty 90, 90d supply, fill #0

## 2022-11-05 MED ORDER — SUMATRIPTAN SUCCINATE 50 MG PO TABS
50.0000 mg | ORAL_TABLET | ORAL | 3 refills | Status: AC | PRN
Start: 2022-11-05 — End: ?
  Filled 2022-11-05: qty 9, 15d supply, fill #0
  Filled 2023-08-01: qty 9, 15d supply, fill #1

## 2022-11-06 ENCOUNTER — Other Ambulatory Visit: Payer: Self-pay

## 2022-11-07 ENCOUNTER — Other Ambulatory Visit: Payer: Self-pay

## 2022-11-12 ENCOUNTER — Other Ambulatory Visit: Payer: Self-pay

## 2022-11-12 DIAGNOSIS — H10503 Unspecified blepharoconjunctivitis, bilateral: Secondary | ICD-10-CM | POA: Diagnosis not present

## 2022-11-12 DIAGNOSIS — H1045 Other chronic allergic conjunctivitis: Secondary | ICD-10-CM | POA: Diagnosis not present

## 2022-11-12 MED ORDER — TOBRAMYCIN-DEXAMETHASONE 0.3-0.1 % OP SUSP
1.0000 [drp] | Freq: Three times a day (TID) | OPHTHALMIC | 0 refills | Status: DC
  Filled 2022-11-12: qty 5, 14d supply, fill #0

## 2022-12-11 ENCOUNTER — Other Ambulatory Visit (HOSPITAL_COMMUNITY)
Admission: RE | Admit: 2022-12-11 | Discharge: 2022-12-11 | Disposition: A | Discharge status: Home / Self Care | Payer: 59 | Source: Ambulatory Visit | Attending: Internal Medicine | Admitting: Internal Medicine

## 2022-12-11 ENCOUNTER — Encounter: Payer: Self-pay | Admitting: Internal Medicine

## 2022-12-11 ENCOUNTER — Other Ambulatory Visit: Payer: Self-pay

## 2022-12-11 ENCOUNTER — Ambulatory Visit (INDEPENDENT_AMBULATORY_CARE_PROVIDER_SITE_OTHER): Payer: 59 | Admitting: Internal Medicine

## 2022-12-11 VITALS — BP 122/82 | HR 105 | Temp 97.9°F | Resp 16 | Ht 62.5 in | Wt 245.4 lb

## 2022-12-11 DIAGNOSIS — N898 Other specified noninflammatory disorders of vagina: Secondary | ICD-10-CM

## 2022-12-11 DIAGNOSIS — N9089 Other specified noninflammatory disorders of vulva and perineum: Secondary | ICD-10-CM | POA: Diagnosis not present

## 2022-12-11 NOTE — Progress Notes (Signed)
   Acute Office Visit  Subjective:     Patient ID: April Buck, female    DOB: [birth year], 37 y.o.   MRN: [id number]  Chief Complaint  Patient presents with   Vaginal Itching   dark spot    On vaginal area pt would like to be examined. Noticed 2-3 weeks ago has been growing as time goes by    HPI Patient is in today for a dark growth on her labia. She first noticed it a few weeks ago, area surrounding the skin lesion is itchy. Nothing has drained from the lesion, is soft and mobile. She has noticed that it is growing in size. She had her husband look at it yesterday and he told her it was black in color which concerned her. She denies any other skin change, no changes in vaginal discharge. She is about to start her period, has been more irregular lately. No urinary symptoms.   Review of Systems  Constitutional:  Negative for chills and fever.  Genitourinary:  Negative for dysuria and hematuria.  Skin:  Positive for itching.        Objective:    BP 122/82   Pulse (!) 105   Temp 97.9 F (36.6 C) (Oral)   Resp 16   Ht 5' 2.5" (1.588 m)   Wt 245 lb 6.4 oz (111.3 kg)   SpO2 98%   BMI 44.17 kg/m  BP Readings from Last 3 Encounters:  12/11/22 122/82  11/05/22 106/68  08/29/22 111/67   Wt Readings from Last 3 Encounters:  12/11/22 245 lb 6.4 oz (111.3 kg)  11/05/22 238 lb 3.2 oz (108 kg)  08/29/22 229 lb 15 oz (104.3 kg)      Physical Exam Exam conducted with a chaperone present.  Constitutional:      Appearance: Normal appearance.  HENT:     Head: Normocephalic and atraumatic.  Eyes:     Conjunctiva/sclera: Conjunctivae normal.  Cardiovascular:     Rate and Rhythm: Normal rate and regular rhythm.  Pulmonary:     Effort: Pulmonary effort is normal.     Breath sounds: Normal breath sounds.  Genitourinary:    General: Normal vulva.     Labia:        Left: Lesion present.      Comments: Hyperpigmented slightly raised lesion at the 5 o'clock position on the  left Skin:    General: Skin is warm and dry.  Neurological:     General: No focal deficit present.     Mental Status: She is alert. Mental status is at baseline.  Psychiatric:        Mood and Affect: Mood normal.        Behavior: Behavior normal.     No results found for any visits on 12/11/22.      Assessment & Plan:   1. Vulvar lesion/Vaginal itching: Exam with hyperpigmented, slightly raised skin lesion, patient states is growing in size. I recommend that this be removed for biopsy, patient does have a gynecologist and will call to schedule to skin lesion removal. Vaginal swab obtained today as well.    - Cervicovaginal ancillary only   Return if symptoms worsen or fail to improve.  Margarita Mail, DO

## 2022-12-12 LAB — CERVICOVAGINAL ANCILLARY ONLY
Bacterial Vaginitis (gardnerella): NEGATIVE
Candida Glabrata: NEGATIVE
Candida Vaginitis: NEGATIVE
Chlamydia: NEGATIVE
Comment: NEGATIVE
Comment: NEGATIVE
Comment: NEGATIVE
Comment: NEGATIVE
Comment: NEGATIVE
Comment: NORMAL
Neisseria Gonorrhea: NEGATIVE
Trichomonas: NEGATIVE

## 2022-12-13 ENCOUNTER — Ambulatory Visit: Payer: 59 | Admitting: Obstetrics & Gynecology

## 2022-12-13 ENCOUNTER — Encounter: Payer: Self-pay | Admitting: Obstetrics & Gynecology

## 2022-12-13 ENCOUNTER — Other Ambulatory Visit (HOSPITAL_COMMUNITY)
Admission: RE | Admit: 2022-12-13 | Discharge: 2022-12-13 | Disposition: A | Discharge status: Home / Self Care | Payer: 59 | Source: Ambulatory Visit | Attending: Obstetrics & Gynecology | Admitting: Obstetrics & Gynecology

## 2022-12-13 VITALS — BP 137/66 | HR 86 | Ht 62.5 in | Wt 246.0 lb

## 2022-12-13 DIAGNOSIS — N9089 Other specified noninflammatory disorders of vulva and perineum: Secondary | ICD-10-CM

## 2022-12-13 DIAGNOSIS — L918 Other hypertrophic disorders of the skin: Secondary | ICD-10-CM | POA: Diagnosis not present

## 2022-12-13 NOTE — Progress Notes (Signed)
    GYNECOLOGY PROGRESS NOTE  Subjective:    Patient ID: April Buck, female    DOB: [birth year], 37 y.o.   MRN: [id number]  HPI  Patient is a 37 y.o. married G2P2002 who presents as a new patient for evaluation of a lesion that she and her husband noted on her left labia majora about 3 weeks ago. She says that it is darker than her surrounding skin.   The following portions of the patient's history were reviewed and updated as appropriate: allergies, current medications, past family history, past medical history, past social history, past surgical history, and problem list.  Review of Systems Pertinent items are noted in HPI.  Her pap was normal last year. She works at [employer].  Objective:   Blood pressure 137/66, pulse 86, height 5' 2.5" (1.588 m), weight 246 lb (111.6 kg), last menstrual period 11/05/2022. Body mass index is 44.28 kg/m. Well nourished, well hydrated White female, no apparent distress She is ambulating and conversing normally. Vulva normal with the exception of a darkish skin tag on the left labia majora near the base. She requested that I remove it. I prepped the  area with Hurricaine spray and then betadine. I then injected 1 mL of 1% lidocaine with a 25 gauge needle. I elevated the skin tag and removed it with small scissors.  Silver nitrate was used to achieve hemostasis. She tolerated the procedure well.   Assessment:   Vulvar lesion  Plan:   Await pathology Rec SVE monthly.

## 2022-12-16 LAB — SURGICAL PATHOLOGY

## 2022-12-17 ENCOUNTER — Encounter: Payer: Self-pay | Admitting: Obstetrics & Gynecology

## 2022-12-17 DIAGNOSIS — N901 Moderate vulvar dysplasia: Secondary | ICD-10-CM | POA: Insufficient documentation

## 2023-01-26 ENCOUNTER — Other Ambulatory Visit: Payer: Self-pay | Attending: Oncology

## 2023-01-29 ENCOUNTER — Other Ambulatory Visit: Payer: Self-pay | Admitting: Internal Medicine

## 2023-01-29 ENCOUNTER — Other Ambulatory Visit: Payer: Self-pay

## 2023-01-29 ENCOUNTER — Encounter: Payer: Self-pay | Admitting: Internal Medicine

## 2023-01-29 DIAGNOSIS — F331 Major depressive disorder, recurrent, moderate: Secondary | ICD-10-CM

## 2023-01-29 MED ORDER — SERTRALINE HCL 50 MG PO TABS
50.0000 mg | ORAL_TABLET | Freq: Every day | ORAL | 0 refills | Status: DC
Start: 2023-01-29 — End: 2023-04-11
  Filled 2023-01-29: qty 90, 90d supply, fill #0

## 2023-01-29 MED ORDER — SERTRALINE HCL 100 MG PO TABS
100.0000 mg | ORAL_TABLET | Freq: Every day | ORAL | 0 refills | Status: DC
Start: 2023-01-29 — End: 2023-04-11
  Filled 2023-01-29 – 2023-03-26 (×2): qty 90, 90d supply, fill #0

## 2023-02-12 ENCOUNTER — Other Ambulatory Visit: Payer: Self-pay

## 2023-02-26 ENCOUNTER — Encounter: Payer: Self-pay | Admitting: Internal Medicine

## 2023-03-03 ENCOUNTER — Other Ambulatory Visit: Payer: Self-pay

## 2023-03-03 ENCOUNTER — Emergency Department
Admission: EM | Admit: 2023-03-03 | Discharge: 2023-03-03 | Disposition: A | Discharge status: Home / Self Care | Payer: Self-pay | Attending: Student in an Organized Health Care Education/Training Program | Admitting: Student in an Organized Health Care Education/Training Program

## 2023-03-03 DIAGNOSIS — L02214 Cutaneous abscess of groin: Secondary | ICD-10-CM | POA: Insufficient documentation

## 2023-03-03 MED ORDER — SULFAMETHOXAZOLE-TRIMETHOPRIM 800-160 MG PO TABS: 1.0000 | ORAL_TABLET | Freq: Two times a day (BID) | ORAL | 0 refills | Status: AC

## 2023-03-03 MED ORDER — CEPHALEXIN 500 MG PO CAPS: 500.0000 mg | ORAL_CAPSULE | Freq: Three times a day (TID) | ORAL | 0 refills | Status: AC

## 2023-03-03 MED ORDER — SULFAMETHOXAZOLE-TRIMETHOPRIM 800-160 MG PO TABS
1.0000 | ORAL_TABLET | Freq: Two times a day (BID) | ORAL | 0 refills | Status: DC
  Filled 2023-03-03: qty 10, 5d supply, fill #0

## 2023-03-03 MED ORDER — LIDOCAINE HCL (PF) 1 % IJ SOLN
5.0000 mL | Freq: Once | INTRAMUSCULAR | Status: AC
  Administered 2023-03-03: 5 mL via INTRADERMAL
  Filled 2023-03-03: qty 5

## 2023-03-03 MED ORDER — CEPHALEXIN 500 MG PO CAPS
500.0000 mg | ORAL_CAPSULE | Freq: Three times a day (TID) | ORAL | 0 refills | Status: DC
  Filled 2023-03-03: qty 21, 7d supply, fill #0

## 2023-03-03 NOTE — Discharge Instructions (Addendum)
The abscess may continue to drain for the next couple of days.  Please apply warm compresses to encourage this.  Wash with soap and water daily then cover with gauze or bandage.  Please take the antibiotics as prescribed.  I recommend washing the area with an antibacterial soap once it is healed to prevent future abscesses.  You can follow-up with your primary care provider as needed.

## 2023-03-03 NOTE — ED Triage Notes (Signed)
Pt here with an abscess in her inner left thigh x3 weeks. Pt states it is not draining but it is tender to touch. Pt states she has had abscesses before but not one in that area. Pt ambulatory to triage.

## 2023-03-03 NOTE — ED Provider Notes (Signed)
[facility] Provider Note    Event Date/Time   First MD Initiated Contact with Patient 03/03/23 1506     (approximate)   History   Abscess   HPI  April Buck is a 37 y.o. female  with PMH of depression, herpes, PCOS, GERD who presents for evaluation of abscess in the left groin.  Patient states she is had it for about 3 weeks, she has tried to take care of it herself using warm compresses and even attempted to pop it.  She states it is very painful and she feels like there is a knot.  She denies fevers at home.      Physical Exam   Triage Vital Signs: ED Triage Vitals  Encounter Vitals Group     BP 03/03/23 1329 (!) 130/92     Systolic BP Percentile --      Diastolic BP Percentile --      Pulse Rate 03/03/23 1329 93     Resp 03/03/23 1329 16     Temp 03/03/23 1329 97.8 F (36.6 C)     Temp Source 03/03/23 1329 Oral     SpO2 03/03/23 1329 97 %     Weight 03/03/23 1328 246 lb 0.5 oz (111.6 kg)     Height 03/03/23 1328 5' 2.5" (1.588 m)     Head Circumference --      Peak Flow --      Pain Score 03/03/23 1328 8     Pain Loc --      Pain Education --      Exclude from Growth Chart --     Most recent vital signs: Vitals:   03/03/23 1329  BP: (!) 130/92  Pulse: 93  Resp: 16  Temp: 97.8 F (36.6 C)  SpO2: 97%    General: Awake, no distress.  CV:  Good peripheral perfusion. RRR. Resp:  Normal effort. CTAB. Abd:  No distention.  Other:  Nodule with surrounding erythema, induration and fluctuance   ED Results / Procedures / Treatments   Labs (all labs ordered are listed, but only abnormal results are displayed) Labs Reviewed - No data to display   PROCEDURES:  Critical Care performed: No  ..Incision and Drainage  Date/Time: 03/03/2023 4:26 PM  Performed by: Cameron Ali, PA-C Authorized by: Cameron Ali, PA-C   Consent:    Consent obtained:  Verbal   Consent given by:  Patient   Risks, benefits, and  alternatives were discussed: yes     Risks discussed:  Bleeding, incomplete drainage, pain and infection   Alternatives discussed:  No treatment Universal protocol:    Patient identity confirmed:  Verbally with patient Location:    Type:  Abscess   Size:  2 cm   Location:  Lower extremity   Lower extremity location:  Leg   Leg location:  L upper leg (left groin) Pre-procedure details:    Skin preparation:  Povidone-iodine Sedation:    Sedation type:  None Anesthesia:    Anesthesia method:  Local infiltration   Local anesthetic:  Lidocaine 1% w/o epi Procedure type:    Complexity:  Simple Procedure details:    Incision types:  Stab incision   Incision depth:  Dermal   Wound management:  Probed and deloculated and irrigated with saline   Drainage:  Bloody and purulent   Drainage amount:  Scant   Wound treatment:  Wound left open   Packing materials:  None Post-procedure details:  Procedure completion:  Tolerated well, no immediate complications    MEDICATIONS ORDERED IN ED: Medications  lidocaine (PF) (XYLOCAINE) 1 % injection 5 mL (has no administration in time range)     IMPRESSION / MDM / ASSESSMENT AND PLAN / ED COURSE  I reviewed the triage vital signs and the nursing notes.                             37 year old female presents for evaluation of a possible abscess. Patient was hypertensive in triage, otherwise VSS. Patient NAD on exam.   Differential diagnosis includes, but is not limited to, abscess, folliculitis, cellulitis.  Patient's presentation is most consistent with acute, uncomplicated illness.  Bedside ultrasound confirmed presence of a small fluid collection.  Incision and drainage performed as described in the procedure note above.  Patient instructed on wound care.  I will start her on oral antibiotics.  She can continue warm compresses.  She voiced understanding, all questions were answered and she was stable at discharge.      FINAL CLINICAL  IMPRESSION(S) / ED DIAGNOSES   Final diagnoses:  Abscess of left groin     Rx / DC Orders   ED Discharge Orders          Ordered    cephALEXin (KEFLEX) 500 MG capsule  3 times daily,   Status:  Discontinued        03/03/23 1632    sulfamethoxazole-trimethoprim (BACTRIM DS) 800-160 MG tablet  2 times daily,   Status:  Discontinued        03/03/23 1632    cephALEXin (KEFLEX) 500 MG capsule  3 times daily        03/03/23 1632    sulfamethoxazole-trimethoprim (BACTRIM DS) 800-160 MG tablet  2 times daily        03/03/23 1632             Note:  This document was prepared using Dragon voice recognition software and may include unintentional dictation errors.   Cameron Ali, PA-C 03/03/23 1634    Willy Eddy, MD 03/03/23 1714

## 2023-03-26 ENCOUNTER — Other Ambulatory Visit: Payer: Self-pay

## 2023-04-11 ENCOUNTER — Other Ambulatory Visit: Payer: Self-pay

## 2023-04-11 ENCOUNTER — Encounter: Payer: Self-pay | Admitting: Internal Medicine

## 2023-04-11 ENCOUNTER — Ambulatory Visit: Payer: Self-pay | Admitting: Internal Medicine

## 2023-04-11 VITALS — BP 124/78 | HR 100 | Temp 97.7°F | Resp 18 | Ht 62.5 in | Wt 249.5 lb

## 2023-04-11 DIAGNOSIS — A6000 Herpesviral infection of urogenital system, unspecified: Secondary | ICD-10-CM

## 2023-04-11 DIAGNOSIS — N901 Moderate vulvar dysplasia: Secondary | ICD-10-CM

## 2023-04-11 DIAGNOSIS — F331 Major depressive disorder, recurrent, moderate: Secondary | ICD-10-CM | POA: Insufficient documentation

## 2023-04-11 DIAGNOSIS — G43109 Migraine with aura, not intractable, without status migrainosus: Secondary | ICD-10-CM

## 2023-04-11 MED ORDER — SERTRALINE HCL 50 MG PO TABS
50.0000 mg | ORAL_TABLET | Freq: Every day | ORAL | 1 refills | Status: DC
  Filled 2023-04-11 – 2023-06-09 (×2): qty 90, 90d supply, fill #0
  Filled 2023-09-10: qty 90, 90d supply, fill #1

## 2023-04-11 MED ORDER — VALACYCLOVIR HCL 500 MG PO TABS
500.0000 mg | ORAL_TABLET | Freq: Every day | ORAL | 1 refills | Status: DC
Start: 2023-04-11 — End: 2023-10-22
  Filled 2023-04-11: qty 90, 90d supply, fill #0
  Filled 2023-08-01: qty 30, 30d supply, fill #0
  Filled 2023-09-10: qty 30, 30d supply, fill #1

## 2023-04-11 MED ORDER — SERTRALINE HCL 100 MG PO TABS
100.0000 mg | ORAL_TABLET | Freq: Every day | ORAL | 1 refills | Status: DC
  Filled 2023-04-11 – 2023-06-09 (×2): qty 90, 90d supply, fill #0
  Filled 2023-09-10: qty 90, 90d supply, fill #1

## 2023-04-11 NOTE — Assessment & Plan Note (Signed)
 Stable, doing well with Imitrex PRN.

## 2023-04-11 NOTE — Assessment & Plan Note (Signed)
 Refill Valtrex to use PRN.

## 2023-04-11 NOTE — Assessment & Plan Note (Signed)
 Referral placed to a different gynecologist to have margins removed.

## 2023-04-11 NOTE — Progress Notes (Signed)
 Established Patient Office Visit  Subjective   Patient ID: April Buck, female    DOB: 1985/07/19  Age: 38 y.o. MRN: [id number]  Chief Complaint  Patient presents with   Anxiety    Follow up    HPI  Patient is here for follow up on chronic medical conditions. Since last office visit, she was sent to Gynecology for mole removal. Biopsy results showed high grade squamous intraepithelial lesion, VIN-2. Since she does not have insurance she did not return to the have the margins removed.   MDD: -Mood status: stable -Current treatment: Zoloft  150 mg increased at LOV -Satisfied with current treatment?: yes -Symptom severity: severe  -Duration of current treatment : months -Side effects: no Medication compliance: excellent compliance     04/11/2023   10:17 AM 12/11/2022    8:07 AM 11/05/2022    3:21 PM 08/02/2022   11:15 AM 04/12/2022    8:07 AM  Depression screen PHQ 2/9  Decreased Interest 0 0 0 1 0  Down, Depressed, Hopeless 0 0 0 0 0  PHQ - 2 Score 0 0 0 1 0  Altered sleeping 0 0 0 0 0  Tired, decreased energy 0 0 0 0 0  Change in appetite 0 0 0 0 0  Feeling bad or failure about yourself  0 0 0 0 0  Trouble concentrating 0 0 0 0 0  Moving slowly or fidgety/restless 0 0 0 0 0  Suicidal thoughts 0 0 0 0 0  PHQ-9 Score 0 0 0 1 0  Difficult doing work/chores Not difficult at all Not difficult at all Not difficult at all Not difficult at all Not difficult at all   Hx of genital HSV: -Currently on Valacyclovir  500 mg as needed - usually has flares about once a month, had one a few weeks ago and needs the medication refilled  Migraines: -Has increased in frequency, has had about 7 since her last office visit 3 months ago -Does take Imitrex  50 mg which does successfully abort the symptoms.  Patient Active Problem List   Diagnosis Date Noted   Moderate episode of recurrent major depressive disorder (HCC) 04/11/2023   Vulvar intraepithelial neoplasia (VIN) grade 2 12/17/2022    Recurrent genital herpes 05/26/2017   History of PCOS 05/26/2017   Migraine with aura and without status migrainosus, not intractable 05/26/2017   Preventative health care 09/29/2015   Obesity (BMI 35.0-39.9 without comorbidity) 08/01/2015   Palpitations 06/08/2015   Irregular periods 06/08/2015   Generalized anxiety disorder 06/08/2015   Past Medical History:  Diagnosis Date   Depression    Frequent headaches    GERD (gastroesophageal reflux disease)    Herpes    Migraines    PCOS (polycystic ovarian syndrome)    Seasonal allergies    Past Surgical History:  Procedure Laterality Date   CESAREAN SECTION     2   CHOLECYSTECTOMY     TUBAL LIGATION     Social History   Tobacco Use   Smoking status: Never   Smokeless tobacco: Never  Vaping Use   Vaping status: Never Used  Substance Use Topics   Alcohol use: Not Currently    Comment: rare   Drug use: No   Social History   Socioeconomic History   Marital status: Legally Separated    Spouse name: Not on file   Number of children: Not on file   Years of education: Not on file   Highest education level: Not on  file  Occupational History   Not on file  Tobacco Use   Smoking status: Never   Smokeless tobacco: Never  Vaping Use   Vaping status: Never Used  Substance and Sexual Activity   Alcohol use: Not Currently    Comment: rare   Drug use: No   Sexual activity: Yes    Birth control/protection: Surgical  Other Topics Concern   Not on file  Social History Narrative   Married.   3 children.    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   Enjoys going to comedy clubs, riding horses, spending time with family.    Social Drivers of Corporate Investment Banker Strain: Low Risk  (11/06/2021)   Overall Financial Resource Strain (CARDIA)    Difficulty of Paying Living Expenses: Not hard at all  Food Insecurity: No Food Insecurity (11/06/2021)   Hunger Vital Sign    Worried About Running Out of Food in the Last Year:  Never true    Ran Out of Food in the Last Year: Never true  Transportation Needs: No Transportation Needs (11/06/2021)   PRAPARE - Administrator, Civil Service (Medical): No    Lack of Transportation (Non-Medical): No  Physical Activity: Sufficiently Active (11/06/2021)   Exercise Vital Sign    Days of Exercise per Week: 4 days    Minutes of Exercise per Session: 50 min  Stress: Stress Concern Present (11/06/2021)   [employer] of Occupational Health - Occupational Stress Questionnaire    Feeling of Stress : To some extent  Social Connections: Socially Isolated (11/06/2021)   Social Connection and Isolation Panel [NHANES]    Frequency of Communication with Friends and Family: Once a week    Frequency of Social Gatherings with Friends and Family: Never    Attends Religious Services: Never    Database Administrator or Organizations: No    Attends Banker Meetings: Never    Marital Status: Married  Catering Manager Violence: Not At Risk (11/06/2021)   Humiliation, Afraid, Rape, and Kick questionnaire    Fear of Current or Ex-Partner: No    Emotionally Abused: No    Physically Abused: No    Sexually Abused: No   Family Status  Relation Name Status   Mother  (Not Specified)   Mat Aunt  (Not Specified)   Mat Aunt  (Not Specified)   MGM  Deceased   MGF  Deceased   PGM  Deceased   PGF  Deceased   Cousin Maternal Deceased  No partnership data on file   Family History  Problem Relation Age of Onset   Diabetes Mother    Hypertension Mother    Heart attack Maternal Aunt    Diabetes Maternal Aunt    Heart attack Maternal Grandmother    Diabetes Maternal Grandmother    Skin cancer Maternal Grandfather    Breast cancer Paternal Grandmother        70s   Rectal cancer Cousin    Allergies  Allergen Reactions   Latex Hives and Swelling      Review of Systems  All other systems reviewed and are negative.     Objective:     BP 124/78 (Cuff Size:  Large)   Pulse 100   Temp 97.7 F (36.5 C) (Oral)   Resp 18   Ht 5' 2.5 (1.588 m)   Wt 249 lb 8 oz (113.2 kg)   SpO2 96%   BMI 44.91 kg/m  BP Readings from Last 3 Encounters:  04/11/23 124/78  03/03/23 (!) 130/92  12/13/22 137/66   Wt Readings from Last 3 Encounters:  04/11/23 249 lb 8 oz (113.2 kg)  03/03/23 246 lb 0.5 oz (111.6 kg)  12/13/22 246 lb (111.6 kg)      Physical Exam Constitutional:      Appearance: Normal appearance.  HENT:     Head: Normocephalic and atraumatic.  Eyes:     Conjunctiva/sclera: Conjunctivae normal.  Cardiovascular:     Rate and Rhythm: Normal rate and regular rhythm.  Pulmonary:     Effort: Pulmonary effort is normal.     Breath sounds: Normal breath sounds.  Skin:    General: Skin is warm and dry.  Neurological:     General: No focal deficit present.     Mental Status: She is alert. Mental status is at baseline.  Psychiatric:        Mood and Affect: Mood normal.        Behavior: Behavior normal.      No results found for any visits on 04/11/23.  Last CBC Lab Results  Component Value Date   WBC 11.0 (H) 08/29/2022   HGB 14.5 08/29/2022   HCT 42.0 08/29/2022   MCV 86.1 08/29/2022   MCH 29.7 08/29/2022   RDW 12.3 08/29/2022   PLT 289 08/29/2022   Last metabolic panel Lab Results  Component Value Date   GLUCOSE 119 (H) 08/29/2022   NA 137 08/29/2022   K 3.1 (L) 08/29/2022   CL 102 08/29/2022   CO2 24 08/29/2022   BUN 9 08/29/2022   CREATININE 0.76 08/29/2022   GFRNONAA >60 08/29/2022   CALCIUM 9.5 08/29/2022   PROT 6.6 08/29/2022   ALBUMIN 3.7 08/29/2022   BILITOT 0.6 08/29/2022   ALKPHOS 42 08/29/2022   AST 23 08/29/2022   ALT 28 08/29/2022   ANIONGAP 11 08/29/2022   Last lipids Lab Results  Component Value Date   CHOL 185 08/02/2022   HDL 44 (L) 08/02/2022   LDLCALC  08/02/2022     Comment:     . LDL cholesterol not calculated. Triglyceride levels greater than 400 mg/dL invalidate calculated LDL  results. . Reference range: <100 . Desirable range <100 mg/dL for primary prevention;   <70 mg/dL for patients with CHD or diabetic patients  with > or = 2 CHD risk factors. SABRA LDL-C is now calculated using the Martin-Hopkins  calculation, which is a validated novel method providing  better accuracy than the Friedewald equation in the  estimation of LDL-C.  Gladis APPLETHWAITE et al. SANDREA. 7986;689(80): 2061-2068  (http://education.[facility].com/faq/FAQ164)    TRIG 413 (H) 08/02/2022   CHOLHDL 4.2 08/02/2022   Last hemoglobin A1c Lab Results  Component Value Date   HGBA1C 5.6 08/02/2022   Last thyroid  functions Lab Results  Component Value Date   TSH 1.86 08/02/2022   Last vitamin D  Lab Results  Component Value Date   VD25OH 33 08/02/2022   Last vitamin B12 and Folate Lab Results  Component Value Date   VITAMINB12 203 (L) 08/17/2015   FOLATE >23.3 08/17/2015      The ASCVD Risk score (Arnett DK, et al., 2019) failed to calculate for the following reasons:   The 2019 ASCVD risk score is only valid for ages 51 to 80    Assessment & Plan:  Moderate episode of recurrent major depressive disorder (HCC) Assessment & Plan: Stable, continue Zoloft  150 mg daily, refilled.  Orders: -  Sertraline  HCl; Take 1 tablet (50 mg total) by mouth daily. Take in addition to 100 mg tablet for a total of 150 mg a day.  Dispense: 90 tablet; Refill: 1 -     Sertraline  HCl; Take 1 tablet (100 mg total) by mouth daily. To take in addition to 50 mg tablet for total of 150 mg daily.  Dispense: 90 tablet; Refill: 1  Recurrent genital herpes Assessment & Plan: Refill Valtrex  to use PRN.  Orders: -     valACYclovir  HCl; Take 1 tablet (500 mg total) by mouth daily.  Dispense: 90 tablet; Refill: 1  Migraine with aura and without status migrainosus, not intractable Assessment & Plan: Stable, doing well with Imitrex  PRN.   Vulvar intraepithelial neoplasia (VIN) grade 2 Assessment &  Plan: Referral placed to a different gynecologist to have margins removed.   Orders: -     Ambulatory referral to Gynecology     Return in about 6 months (around 10/09/2023).    [name], DO

## 2023-04-11 NOTE — Assessment & Plan Note (Signed)
 Stable, continue Zoloft 150 mg daily, refilled.

## 2023-04-22 ENCOUNTER — Other Ambulatory Visit: Payer: Self-pay

## 2023-05-21 ENCOUNTER — Other Ambulatory Visit: Payer: Self-pay

## 2023-05-21 MED ORDER — TRIAMCINOLONE ACETONIDE 0.1 % EX OINT
1.0000 | TOPICAL_OINTMENT | Freq: Two times a day (BID) | CUTANEOUS | 1 refills | Status: AC | PRN
Start: 2023-05-21 — End: ?
  Filled 2023-05-21: qty 30, 15d supply, fill #0

## 2023-05-23 ENCOUNTER — Ambulatory Visit: Payer: Self-pay

## 2023-05-23 NOTE — Telephone Encounter (Signed)
  Chief Complaint: back pain Symptoms: mid to lower back pain, constant, movement makes pain worse standing 10/10, sitting 7/10, radiates down both legs  Frequency: 1 week but got worse 2 days ago  Pertinent Negatives: NA Disposition: [] ED /[] Urgent Care (no appt availability in office) / [x] Appointment(In office/virtual)/ []  [facility]/ [] Home Care/ [] Refused Recommended Disposition /[]  Mobile Bus/ []  Follow-up with PCP Additional Notes: pt states started having back pain 1 week ago, 2 nights ago laying in bed and moved down and felt pain worsen. Pt has been using heat pad and massage chair and Ibuprofen 800 mg prn for pain, doesn't get a lot of improvement. Scheduled OV for 05/26/23 at 1020 with [place], [place]. Pt preferred PCP but no appts until 06/10/23. Care advice given and recommended if pain worsens before appt can go to UC or ED if needed. Pt verbalized understanding.   Reason for Disposition  [1] SEVERE back pain (e.g., excruciating, unable to do any normal activities) AND [2] not improved 2 hours after pain medicine  Answer Assessment - Initial Assessment Questions 1. ONSET: "When did the pain begin?"      1 week 2 days got worse  2. LOCATION: "Where does it hurt?" (upper, mid or lower back)     Mid to lower  3. SEVERITY: "How bad is the pain?"  (e.g., Scale 1-10; mild, moderate, or severe)   - MILD (1-3): Doesn't interfere with normal activities.    - MODERATE (4-7): Interferes with normal activities or awakens from sleep.    - SEVERE (8-10): Excruciating pain, unable to do any normal activities.      Sitting 7/10, standing 10/10 4. PATTERN: "Is the pain constant?" (e.g., yes, no; constant, intermittent)      Constant  5. RADIATION: "Does the pain shoot into your legs or somewhere else?"     Down both  8. MEDICINES: "What have you taken so far for the pain?" (e.g., nothing, acetaminophen, NSAIDS)     Ibuprofen 800 mg  10. OTHER SYMPTOMS: "Do you have any other  symptoms?" (e.g., fever, abdomen pain, burning with urination, blood in urine)       Movement worse for pain  Protocols used: Back Pain-A-AH

## 2023-05-26 ENCOUNTER — Ambulatory Visit: Payer: Self-pay | Admitting: Family Medicine

## 2023-06-09 ENCOUNTER — Other Ambulatory Visit: Payer: Self-pay

## 2023-07-25 ENCOUNTER — Encounter: Payer: Self-pay | Admitting: Internal Medicine

## 2023-07-30 ENCOUNTER — Other Ambulatory Visit: Payer: Self-pay

## 2023-07-30 MED ORDER — SPIRONOLACTONE 50 MG PO TABS
50.0000 mg | ORAL_TABLET | Freq: Every day | ORAL | 11 refills | Status: DC
  Filled 2023-07-30 (×2): qty 30, 30d supply, fill #0
  Filled 2023-09-10: qty 30, 30d supply, fill #1

## 2023-07-30 MED ORDER — METFORMIN HCL 500 MG PO TABS
ORAL_TABLET | ORAL | 0 refills | Status: DC
  Filled 2023-07-30: qty 42, 21d supply, fill #0
  Filled 2023-07-31: qty 98, 28d supply, fill #0

## 2023-07-31 ENCOUNTER — Other Ambulatory Visit: Payer: Self-pay

## 2023-08-01 ENCOUNTER — Ambulatory Visit: Payer: Self-pay | Admitting: Internal Medicine

## 2023-08-01 ENCOUNTER — Other Ambulatory Visit: Payer: Self-pay

## 2023-08-01 ENCOUNTER — Encounter: Payer: Self-pay | Admitting: Internal Medicine

## 2023-08-01 VITALS — BP 124/76 | HR 100 | Resp 16 | Ht 62.5 in | Wt 250.9 lb

## 2023-08-01 DIAGNOSIS — M79606 Pain in leg, unspecified: Secondary | ICD-10-CM

## 2023-08-01 DIAGNOSIS — E559 Vitamin D deficiency, unspecified: Secondary | ICD-10-CM

## 2023-08-01 DIAGNOSIS — R7303 Prediabetes: Secondary | ICD-10-CM

## 2023-08-01 DIAGNOSIS — M7989 Other specified soft tissue disorders: Secondary | ICD-10-CM

## 2023-08-01 DIAGNOSIS — Z13220 Encounter for screening for lipoid disorders: Secondary | ICD-10-CM

## 2023-08-01 DIAGNOSIS — R5383 Other fatigue: Secondary | ICD-10-CM

## 2023-08-01 DIAGNOSIS — M2550 Pain in unspecified joint: Secondary | ICD-10-CM

## 2023-08-01 DIAGNOSIS — E538 Deficiency of other specified B group vitamins: Secondary | ICD-10-CM

## 2023-08-01 NOTE — Progress Notes (Signed)
 Acute Office Visit  Subjective:     Patient ID: April Buck, female    DOB: 1985/05/08, 38 y.o.   MRN: [id number]  Chief Complaint  Patient presents with   Muscle Pain   Edema    Ankle and feet    Muscle Pain Pertinent negatives include no chest pain, fever or shortness of breath.   Patient is in today for joint pain, swelling in her bilateral lower extremity.   Discussed the use of AI scribe software for clinical note transcription with the patient, who gave verbal consent to proceed.  History of Present Illness The patient is presenting with worsening joint pain and ongoing swelling in the feet and ankles. The swelling is persistent and unresponsive to compression socks, elevation, and NSAIDs. The patient also reports significant fatigue, necessitating daytime naps, and describes a sensation of sudden energy drain.  In addition to these symptoms, the patient has been experiencing episodes of rapid heart rate, lasting approximately ten seconds, while at rest. These episodes are not associated with palpitations or skipped beats.  The patient has been taking vitamin D  and B12 supplements and has been trying to lose weight. The patient also reports perimenopausal symptoms, including night sweats, which have been confirmed by her gynecologist.  The patient is scheduled to see a dermatologist for evaluation of potential psoriatic arthritis. The patient also reports a history of joint pain and swelling, which has not responded to NSAIDs.    Review of Systems  Constitutional:  Positive for malaise/fatigue. Negative for chills and fever.  Respiratory:  Negative for shortness of breath.   Cardiovascular:  Positive for leg swelling. Negative for chest pain and palpitations.  Musculoskeletal:  Positive for joint pain.  Skin: Negative.         Objective:    BP 124/76 (Cuff Size: Large)   Pulse 100   Resp 16   Ht 5' 2.5" (1.588 m)   Wt 250 lb 14.4 oz (113.8 kg)   SpO2 98%    BMI 45.16 kg/m  BP Readings from Last 3 Encounters:  08/01/23 124/76  04/11/23 124/78  03/03/23 (!) 130/92   Wt Readings from Last 3 Encounters:  08/01/23 250 lb 14.4 oz (113.8 kg)  04/11/23 249 lb 8 oz (113.2 kg)  03/03/23 246 lb 0.5 oz (111.6 kg)      Physical Exam Constitutional:      Appearance: Normal appearance.  HENT:     Head: Normocephalic and atraumatic.     Mouth/Throat:     Mouth: Mucous membranes are moist.     Pharynx: Oropharynx is clear.  Eyes:     Extraocular Movements: Extraocular movements intact.     Conjunctiva/sclera: Conjunctivae normal.     Pupils: Pupils are equal, round, and reactive to light.  Neck:     Comments: No thyromegaly Cardiovascular:     Rate and Rhythm: Normal rate and regular rhythm.  Pulmonary:     Effort: Pulmonary effort is normal.     Breath sounds: Normal breath sounds.  Musculoskeletal:     Cervical back: No tenderness.     Comments: Mild non-pitting BLE swelling  Lymphadenopathy:     Cervical: No cervical adenopathy.  Skin:    General: Skin is warm and dry.  Neurological:     General: No focal deficit present.     Mental Status: She is alert. Mental status is at baseline.  Psychiatric:        Mood and Affect: Mood normal.  Behavior: Behavior normal.     No results found for any visits on 08/01/23.      Assessment & Plan:   Assessment & Plan Fatigue Persistent fatigue despite normal thyroid  function and ongoing vitamin D  and B12 supplementation. Differential includes perimenopausal symptoms, vitamin deficiencies, and autoimmune conditions. - Order vitamin D  and B12 levels. - Order complete metabolic panel. - Order complete blood count. - Order cholesterol panel.  Joint pain and swelling Chronic joint pain and swelling in feet and ankles with positive ANA 1:80 last year. Differential includes rheumatoid arthritis and psoriatic arthritis. Non-pitting edema suggests non-vascular etiology. NSAIDs  ineffective. - Order ANA, RF and anti-CCP. - Follow up with rheumatology. - Continue compression stockings and leg elevation.  Pre-diabetes A1c in prediabetic range at 5.7%. - Discuss lifestyle modifications including diet and exercise.  - CBC w/Diff/Platelet - COMPLETE METABOLIC PANEL WITHOUT GFR - Rheumatoid Factor - Cyclic citrul peptide antibody, IgG - CBC w/Diff/Platelet - COMPLETE METABOLIC PANEL WITHOUT GFR - Antinuclear Antib (ANA) - Vitamin D  (25 hydroxy) - Vitamin B12 - Lipid Profile   Return for already scheduled.  Rockney Cid, DO

## 2023-08-03 ENCOUNTER — Encounter: Payer: Self-pay | Admitting: Internal Medicine

## 2023-08-05 LAB — CBC WITH DIFFERENTIAL/PLATELET
Absolute Lymphocytes: 2380 {cells}/uL (ref 850–3900)
Absolute Monocytes: 577 {cells}/uL (ref 200–950)
Basophils Absolute: 7 {cells}/uL (ref 0–200)
Basophils Relative: 0.1 %
Eosinophils Absolute: 0 {cells}/uL — ABNORMAL LOW (ref 15–500)
Eosinophils Relative: 0 %
HCT: 42.9 % (ref 35.0–45.0)
Hemoglobin: 14.4 g/dL (ref 11.7–15.5)
MCH: 29.3 pg (ref 27.0–33.0)
MCHC: 33.6 g/dL (ref 32.0–36.0)
MCV: 87.2 fL (ref 80.0–100.0)
MPV: 9.9 fL (ref 7.5–12.5)
Monocytes Relative: 7.9 %
Neutro Abs: 4336 {cells}/uL (ref 1500–7800)
Neutrophils Relative %: 59.4 %
Platelets: 283 10*3/uL (ref 140–400)
RBC: 4.92 10*6/uL (ref 3.80–5.10)
RDW: 13 % (ref 11.0–15.0)
Total Lymphocyte: 32.6 %
WBC: 7.3 10*3/uL (ref 3.8–10.8)

## 2023-08-05 LAB — VITAMIN B12: Vitamin B-12: 412 pg/mL (ref 200–1100)

## 2023-08-05 LAB — ANTI-NUCLEAR AB-TITER (ANA TITER): ANA Titer 1: 1:40 {titer} — ABNORMAL HIGH

## 2023-08-05 LAB — COMPLETE METABOLIC PANEL WITHOUT GFR
AG Ratio: 1.6 (calc) (ref 1.0–2.5)
ALT: 35 U/L — ABNORMAL HIGH (ref 6–29)
AST: 22 U/L (ref 10–30)
Albumin: 4.3 g/dL (ref 3.6–5.1)
Alkaline phosphatase (APISO): 48 U/L (ref 31–125)
BUN: 10 mg/dL (ref 7–25)
CO2: 28 mmol/L (ref 20–32)
Calcium: 9.6 mg/dL (ref 8.6–10.2)
Chloride: 103 mmol/L (ref 98–110)
Creat: 0.66 mg/dL (ref 0.50–0.97)
Globulin: 2.7 g/dL (ref 1.9–3.7)
Glucose, Bld: 110 mg/dL — ABNORMAL HIGH (ref 65–99)
Potassium: 3.9 mmol/L (ref 3.5–5.3)
Sodium: 138 mmol/L (ref 135–146)
Total Bilirubin: 0.5 mg/dL (ref 0.2–1.2)
Total Protein: 7 g/dL (ref 6.1–8.1)

## 2023-08-05 LAB — CYCLIC CITRUL PEPTIDE ANTIBODY, IGG: Cyclic Citrullin Peptide Ab: <16 U

## 2023-08-05 LAB — RHEUMATOID FACTOR: Rheumatoid fact SerPl-aCnc: <10 [IU]/mL (ref <14)

## 2023-08-05 LAB — LIPID PANEL
Cholesterol: 191 mg/dL (ref <200)
HDL: 44 mg/dL — ABNORMAL LOW (ref >=50)
Non-HDL Cholesterol (Calc): 147 mg/dL — ABNORMAL HIGH (ref <130)
Total CHOL/HDL Ratio: 4.3 (calc) (ref <5.0)
Triglycerides: 423 mg/dL — ABNORMAL HIGH (ref <150)

## 2023-08-05 LAB — VITAMIN D 25 HYDROXY (VIT D DEFICIENCY, FRACTURES): Vit D, 25-Hydroxy: 33 ng/mL (ref 30–100)

## 2023-08-05 LAB — ANA: Anti Nuclear Antibody (ANA): POSITIVE — AB

## 2023-08-06 ENCOUNTER — Other Ambulatory Visit: Payer: Self-pay | Admitting: Internal Medicine

## 2023-08-06 ENCOUNTER — Other Ambulatory Visit: Payer: Self-pay

## 2023-08-06 ENCOUNTER — Ambulatory Visit: Payer: 59 | Admitting: Internal Medicine

## 2023-08-06 ENCOUNTER — Encounter: Payer: Self-pay | Admitting: Internal Medicine

## 2023-08-06 DIAGNOSIS — E781 Pure hyperglyceridemia: Secondary | ICD-10-CM

## 2023-08-06 MED ORDER — FENOFIBRATE 48 MG PO TABS
48.0000 mg | ORAL_TABLET | Freq: Every day | ORAL | 1 refills | Status: DC
  Filled 2023-08-06: qty 90, 90d supply, fill #0

## 2023-09-10 ENCOUNTER — Other Ambulatory Visit: Payer: Self-pay

## 2023-09-10 MED ORDER — METFORMIN HCL 1000 MG PO TABS
1000.0000 mg | ORAL_TABLET | Freq: Two times a day (BID) | ORAL | 11 refills | Status: DC
  Filled 2023-09-10: qty 60, 30d supply, fill #0

## 2023-10-09 ENCOUNTER — Ambulatory Visit: Payer: Self-pay | Admitting: Internal Medicine

## 2023-10-18 ENCOUNTER — Encounter: Payer: Self-pay | Admitting: Internal Medicine

## 2023-10-22 ENCOUNTER — Ambulatory Visit (INDEPENDENT_AMBULATORY_CARE_PROVIDER_SITE_OTHER): Payer: Self-pay | Admitting: Internal Medicine

## 2023-10-22 ENCOUNTER — Encounter: Payer: Self-pay | Admitting: Internal Medicine

## 2023-10-22 ENCOUNTER — Other Ambulatory Visit: Payer: Self-pay

## 2023-10-22 VITALS — BP 120/76 | HR 102 | Temp 97.8°F | Resp 16 | Ht 62.5 in | Wt 251.2 lb

## 2023-10-22 DIAGNOSIS — F331 Major depressive disorder, recurrent, moderate: Secondary | ICD-10-CM | POA: Diagnosis not present

## 2023-10-22 DIAGNOSIS — M2550 Pain in unspecified joint: Secondary | ICD-10-CM

## 2023-10-22 DIAGNOSIS — G4700 Insomnia, unspecified: Secondary | ICD-10-CM | POA: Diagnosis not present

## 2023-10-22 DIAGNOSIS — E781 Pure hyperglyceridemia: Secondary | ICD-10-CM | POA: Diagnosis not present

## 2023-10-22 DIAGNOSIS — A6000 Herpesviral infection of urogenital system, unspecified: Secondary | ICD-10-CM

## 2023-10-22 DIAGNOSIS — R1011 Right upper quadrant pain: Secondary | ICD-10-CM

## 2023-10-22 MED ORDER — SERTRALINE HCL 100 MG PO TABS: 100.0000 mg | ORAL_TABLET | Freq: Every day | ORAL | 1 refills | Status: DC

## 2023-10-22 MED ORDER — VALACYCLOVIR HCL 500 MG PO TABS: 500.0000 mg | ORAL_TABLET | Freq: Every day | ORAL | 1 refills | Status: AC

## 2023-10-22 MED ORDER — FENOFIBRATE 145 MG PO TABS: 145.0000 mg | ORAL_TABLET | Freq: Every day | ORAL | 1 refills | Status: DC

## 2023-10-22 MED ORDER — HYDROXYZINE HCL 10 MG PO TABS: 10.0000 mg | ORAL_TABLET | Freq: Every evening | ORAL | 1 refills | Status: DC | PRN

## 2023-10-22 MED ORDER — SERTRALINE HCL 50 MG PO TABS: 50.0000 mg | ORAL_TABLET | Freq: Every day | ORAL | 1 refills | Status: DC

## 2023-10-22 NOTE — Progress Notes (Signed)
 Acute Office Visit  Subjective:     Patient ID: April Buck, female    DOB: 1985/08/23, 38 y.o.   MRN: [id number]  Chief Complaint  Patient presents with   Follow-up    HPI Patient is in today for follow up.   Discussed the use of AI scribe software for clinical note transcription with the patient, who gave verbal consent to proceed.  History of Present Illness April Buck is a 38 year old female who presents with right upper quadrant pain.  She experiences right upper quadrant pain radiating to the middle of her back, similar to gallbladder pain despite a prior cholecystectomy. The pain was severe enough to cause her to bend over at work and miss work due to nausea and widespread pain. Currently, the pain is a dull ache. She denies fever or acid reflux symptoms.  She experiences joint pain and fatigue, which she associates with difficulty sleeping. She takes a long time to fall asleep and wakes up multiple times during the night, attributing this to joint pain. Anti-inflammatory medications such as ibuprofen, naproxen , and Mobic  have been ineffective. Magnesium supplements have also not helped. The pain is in her joints rather than muscles, and she experiences restless leg-like symptoms.  Her liver enzymes were slightly elevated during the last check. She is ANA positive and has previously seen a rheumatologist. She has tried various medications for sleep, including melatonin, which caused vivid dreams, and is considering hydroxyzine  for sleep aid.  She is currently taking Zoloft  for depression, Tricor  for high triglycerides, and metformin  for diabetes management. She takes Tricor  at night and metformin  500 mg twice a day. She has experienced black stools with a previous prescription anti-inflammatory, leading to discontinuation. Her triglycerides have been high, with the last measurement at 423 mg/dL.    Review of Systems  Constitutional:  Positive for malaise/fatigue.  Negative for chills and fever.  Gastrointestinal:  Positive for abdominal pain. Negative for heartburn, nausea and vomiting.  Musculoskeletal:  Positive for joint pain.  Psychiatric/Behavioral:  The patient has insomnia.         Objective:    BP 120/76 (Cuff Size: Large)   Pulse (!) 102   Temp 97.8 F (36.6 C) (Oral)   Resp 16   Ht 5' 2.5 (1.588 m)   Wt 251 lb 3.2 oz (113.9 kg)   SpO2 94%   BMI 45.21 kg/m  BP Readings from Last 3 Encounters:  10/22/23 120/76  08/01/23 124/76  04/11/23 124/78   Wt Readings from Last 3 Encounters:  10/22/23 251 lb 3.2 oz (113.9 kg)  08/01/23 250 lb 14.4 oz (113.8 kg)  04/11/23 249 lb 8 oz (113.2 kg)      Physical Exam Constitutional:      Appearance: Normal appearance.  HENT:     Head: Normocephalic and atraumatic.  Eyes:     Conjunctiva/sclera: Conjunctivae normal.  Cardiovascular:     Rate and Rhythm: Normal rate and regular rhythm.  Pulmonary:     Effort: Pulmonary effort is normal.     Breath sounds: Normal breath sounds.  Abdominal:     General: Bowel sounds are normal. There is no distension.     Palpations: Abdomen is soft.     Tenderness: There is abdominal tenderness. There is no guarding or rebound.     Comments: Severe pain to palpation in RUQ  Skin:    General: Skin is warm and dry.  Neurological:     General: No focal  deficit present.     Mental Status: She is alert. Mental status is at baseline.  Psychiatric:        Mood and Affect: Mood normal.        Behavior: Behavior normal.     No results found for any visits on 10/22/23.      Assessment & Plan:   Assessment & Plan Right upper quadrant abdominal pain Intermittent severe pain post-cholecystectomy, possibly due to post-cholecystectomy syndrome, liver pathology, or pancreatitis. Slightly elevated liver enzymes previously but resolved. Ultrasound preferred for initial evaluation. - Order abdominal ultrasound to evaluate liver and gallbladder stump. -  Consider HIDA scan if ultrasound is normal and pain persists.  Hypertriglyceridemia Triglycerides elevated at 423 mg/dL, risk of pancreatitis and gallstone formation. Current low-dose fenofibrate  without adverse effects. - Increase fenofibrate  (Tricor ) dosage to manage triglyceride levels.  Fatigue and joint pain Chronic fatigue and joint pain possibly linked to sleep disturbances and underlying autoimmune condition. Previous ANA positive but not significant. Anti-inflammatory medications ineffective, reluctance to use steroids. - Prescribe hydroxyzine  10 mg for sleep, with instructions to increase dose if needed. - Continue magnesium supplementation, with a maximum dose of 500 mg.  Medication management Managing multiple medications with pharmacy and insurance issues. - Send prescriptions to [facility] in [place] for insurance coverage. - Refill Zoloft , metformin , and Valtrex  prescriptions.  - hydrOXYzine  (ATARAX ) 10 MG tablet; Take 1 tablet (10 mg total) by mouth at bedtime as needed (insomnia).  Dispense: 90 tablet; Refill: 1 - sertraline  (ZOLOFT ) 100 MG tablet; Take 1 tablet (100 mg total) by mouth daily. To take in addition to 50 mg tablet for total of 150 mg daily.  Dispense: 90 tablet; Refill: 1 - sertraline  (ZOLOFT ) 50 MG tablet; Take 1 tablet (50 mg total) by mouth daily. Take in addition to 100 mg tablet for a total of 150 mg a day.  Dispense: 90 tablet; Refill: 1 - valACYclovir  (VALTREX ) 500 MG tablet; Take 1 tablet (500 mg total) by mouth daily.  Dispense: 90 tablet; Refill: 1 - fenofibrate  (TRICOR ) 145 MG tablet; Take 1 tablet (145 mg total) by mouth daily.  Dispense: 90 tablet; Refill: 1 - US  Abdomen Limited RUQ (LIVER/GB); Future   Return in about 3 months (around 01/22/2024).  [name], DO

## 2023-10-23 ENCOUNTER — Ambulatory Visit
Admission: RE | Admit: 2023-10-23 | Discharge: 2023-10-23 | Disposition: A | Discharge status: Home / Self Care | Source: Ambulatory Visit | Attending: Internal Medicine | Admitting: Internal Medicine

## 2023-10-23 DIAGNOSIS — R1011 Right upper quadrant pain: Secondary | ICD-10-CM | POA: Diagnosis present

## 2023-10-24 ENCOUNTER — Ambulatory Visit: Payer: Self-pay | Admitting: Internal Medicine

## 2023-10-24 ENCOUNTER — Telehealth: Payer: Self-pay

## 2023-10-24 NOTE — Telephone Encounter (Signed)
 Copied from CRM [id number]. Topic: Clinical - Lab/Test Results >> Oct 24, 2023  3:43 PM Selinda RAMAN wrote: Reason for CRM: The patient returned a call concerning her U/S results. I spoke with Cassandra and she said Sherrilyn had left and the other nurse was tied up. She said she may or may not hear back from someone today. Please assist patient further

## 2023-10-27 NOTE — Telephone Encounter (Signed)
 Spoke to patient

## 2024-01-20 ENCOUNTER — Other Ambulatory Visit: Payer: Self-pay | Admitting: Medical Genetics

## 2024-01-20 DIAGNOSIS — Z006 Encounter for examination for normal comparison and control in clinical research program: Secondary | ICD-10-CM

## 2024-01-22 ENCOUNTER — Other Ambulatory Visit: Payer: Self-pay | Admitting: Internal Medicine

## 2024-01-22 ENCOUNTER — Ambulatory Visit (INDEPENDENT_AMBULATORY_CARE_PROVIDER_SITE_OTHER): Admitting: Internal Medicine

## 2024-01-22 ENCOUNTER — Encounter: Payer: Self-pay | Admitting: Internal Medicine

## 2024-01-22 ENCOUNTER — Other Ambulatory Visit: Payer: Self-pay

## 2024-01-22 VITALS — BP 120/74 | HR 93 | Temp 97.9°F | Resp 16 | Ht 62.5 in | Wt 254.3 lb

## 2024-01-22 DIAGNOSIS — M2550 Pain in unspecified joint: Secondary | ICD-10-CM | POA: Diagnosis not present

## 2024-01-22 DIAGNOSIS — Z23 Encounter for immunization: Secondary | ICD-10-CM | POA: Diagnosis not present

## 2024-01-22 DIAGNOSIS — R7303 Prediabetes: Secondary | ICD-10-CM

## 2024-01-22 DIAGNOSIS — R5383 Other fatigue: Secondary | ICD-10-CM | POA: Diagnosis not present

## 2024-01-22 DIAGNOSIS — N6324 Unspecified lump in the left breast, lower inner quadrant: Secondary | ICD-10-CM

## 2024-01-22 LAB — POCT GLYCOSYLATED HEMOGLOBIN (HGB A1C): Hemoglobin A1C: 6.2 % — AB (ref 4.0–5.6)

## 2024-01-22 MED ORDER — METFORMIN HCL 500 MG PO TABS: 500.0000 mg | ORAL_TABLET | Freq: Every day | ORAL | 1 refills | Status: AC

## 2024-01-22 NOTE — Progress Notes (Signed)
 Established Patient Office Visit  Subjective   Patient ID: April Buck, female    DOB: Aug 04, 1985  Age: 38 y.o. MRN: [id number]  Chief Complaint  Patient presents with   Medical Management of Chronic Issues    3 month recheck    HPI  Patient is here for follow up on chronic medical conditions.   Discussed the use of AI scribe software for clinical note transcription with the patient, who gave verbal consent to proceed.  History of Present Illness April Buck is a 38 year old female with prediabetes and joint pain who presents with worsening joint pain and elevated A1c levels.  She experiences persistent joint pain in her hands, ankles, spine, and hips, affecting sleep and daily activities. Magnesium supplements have not provided relief. A rheumatologist previously evaluated her, and she had a positive ANA test a year ago.  Her A1c has increased from 5.7% to 6.2%. She takes metformin  inconsistently without gastrointestinal issues. Spironolactone  was prescribed for possible PCOS symptoms but is not taken due to lack of benefit. Hydroxyzine  causes excessive sedation, so she avoids it.  She has allergies to wheat, gluten, cow's milk, hazelnuts, peanuts, apples, and crab, with difficulty avoiding wheat and gluten. Cow's milk causes gastrointestinal discomfort.  She has a palpable breast lump that has not changed in size or tenderness and seeks clarification on breast self-exams.   MDD: -Mood status: stable -Current treatment: Zoloft  150 mg  -Satisfied with current treatment?: yes -Symptom severity: severe  -Duration of current treatment : months -Side effects: no Medication compliance: excellent compliance     01/22/2024   10:44 AM 08/01/2023    1:22 PM 04/11/2023   10:17 AM 12/11/2022    8:07 AM 11/05/2022    3:21 PM  Depression screen PHQ 2/9  Decreased Interest 0 0 0 0 0  Down, Depressed, Hopeless 0 0 0 0 0  PHQ - 2 Score 0 0 0 0 0  Altered sleeping 0  0 0 0   Tired, decreased energy 0  0 0 0  Change in appetite 0  0 0 0  Feeling bad or failure about yourself  0  0 0 0  Trouble concentrating 0  0 0 0  Moving slowly or fidgety/restless 0  0 0 0  Suicidal thoughts 0  0 0 0  PHQ-9 Score 0  0 0 0  Difficult doing work/chores Not difficult at all  Not difficult at all Not difficult at all Not difficult at all   Hx of genital HSV: -Currently on Valacyclovir  500 mg as needed - usually has flares about once a month, had one a few weeks ago and needs the medication refilled  Migraines: -Does take Imitrex  50 mg which does successfully abort the symptoms. -No change in frequency of migraines  Patient Active Problem List   Diagnosis Date Noted   Moderate episode of recurrent major depressive disorder (HCC) 04/11/2023   Vulvar intraepithelial neoplasia (VIN) grade 2 12/17/2022   Recurrent genital herpes 05/26/2017   History of PCOS 05/26/2017   Migraine with aura and without status migrainosus, not intractable 05/26/2017   Preventative health care 09/29/2015   Obesity (BMI 35.0-39.9 without comorbidity) 08/01/2015   Palpitations 06/08/2015   Irregular periods 06/08/2015   Generalized anxiety disorder 06/08/2015   Past Medical History:  Diagnosis Date   Depression    Frequent headaches    GERD (gastroesophageal reflux disease)    Herpes    Migraines    PCOS (polycystic  ovarian syndrome)    Seasonal allergies    Past Surgical History:  Procedure Laterality Date   CESAREAN SECTION     2   CHOLECYSTECTOMY     TUBAL LIGATION     Social History   Tobacco Use   Smoking status: Never   Smokeless tobacco: Never  Vaping Use   Vaping status: Never Used  Substance Use Topics   Alcohol use: Not Currently    Comment: rare   Drug use: No   Social History   Socioeconomic History   Marital status: Legally Separated    Spouse name: Not on file   Number of children: Not on file   Years of education: Not on file   Highest education level:  Not on file  Occupational History   Not on file  Tobacco Use   Smoking status: Never   Smokeless tobacco: Never  Vaping Use   Vaping status: Never Used  Substance and Sexual Activity   Alcohol use: Not Currently    Comment: rare   Drug use: No   Sexual activity: Yes    Birth control/protection: Surgical  Other Topics Concern   Not on file  Social History Narrative   Married.   3 children.    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   Enjoys going to comedy clubs, riding horses, spending time with family.    Social Drivers of Corporate investment banker Strain: Low Risk  (05/21/2023)   Received from [facility]   Overall Financial Resource Strain (CARDIA)    Difficulty of Paying Living Expenses: Not hard at all  Food Insecurity: No Food Insecurity (05/21/2023)   Received from [facility]   Hunger Vital Sign    Within the past 12 months, you worried that your food would run out before you got the money to buy more.: Never true    Within the past 12 months, the food you bought just didn't last and you didn't have money to get more.: Never true  Transportation Needs: No Transportation Needs (05/21/2023)   Received from [facility] - Transportation    In the past 12 months, has lack of transportation kept you from medical appointments or from getting medications?: No    Lack of Transportation (Non-Medical): No  Physical Activity: Sufficiently Active (11/06/2021)   Exercise Vital Sign    Days of Exercise per Week: 4 days    Minutes of Exercise per Session: 50 min  Stress: Stress Concern Present (11/06/2021)   [employer] of Occupational Health - Occupational Stress Questionnaire    Feeling of Stress : To some extent  Social Connections: Socially Isolated (11/06/2021)   Social Connection and Isolation Panel    Frequency of Communication with Friends and Family: Once a week    Frequency of Social Gatherings with  Friends and Family: Never    Attends Religious Services: Never    Database administrator or Organizations: No    Attends Banker Meetings: Never    Marital Status: Married  Catering manager Violence: Not At Risk (11/06/2021)   Humiliation, Afraid, Rape, and Kick questionnaire    Fear of Current or Ex-Partner: No    Emotionally Abused: No    Physically Abused: No    Sexually Abused: No   Family Status  Relation Name Status   Mother  (Not Specified)   Mat Aunt  (Not Specified)   Mat Aunt  (Not  Specified)   MGM  Deceased   MGF  Deceased   PGM  Deceased   PGF  Deceased   Cousin Maternal Deceased  No partnership data on file   Family History  Problem Relation Age of Onset   Diabetes Mother    Hypertension Mother    Heart attack Maternal Aunt    Diabetes Maternal Aunt    Heart attack Maternal Grandmother    Diabetes Maternal Grandmother    Skin cancer Maternal Grandfather    Breast cancer Paternal Grandmother        26s   Rectal cancer Cousin    Allergies  Allergen Reactions   Latex Hives and Swelling      Review of Systems  Constitutional:  Positive for malaise/fatigue.  Musculoskeletal:  Positive for joint pain.      Objective:     BP 120/74 (Cuff Size: Large)   Pulse 93   Temp 97.9 F (36.6 C) (Oral)   Resp 16   Ht 5' 2.5 (1.588 m)   Wt 254 lb 4.8 oz (115.3 kg)   SpO2 93%   BMI 45.77 kg/m  BP Readings from Last 3 Encounters:  01/22/24 120/74  10/22/23 120/76  08/01/23 124/76   Wt Readings from Last 3 Encounters:  01/22/24 254 lb 4.8 oz (115.3 kg)  10/22/23 251 lb 3.2 oz (113.9 kg)  08/01/23 250 lb 14.4 oz (113.8 kg)      Physical Exam Exam conducted with a chaperone present.  Constitutional:      Appearance: Normal appearance.  HENT:     Head: Normocephalic and atraumatic.  Eyes:     Conjunctiva/sclera: Conjunctivae normal.  Cardiovascular:     Rate and Rhythm: Normal rate and regular rhythm.  Pulmonary:     Effort:  Pulmonary effort is normal.     Breath sounds: Normal breath sounds.  Chest:  Breasts:    Right: Normal.     Left: Mass present. No swelling, bleeding, inverted nipple, nipple discharge, skin change or tenderness.     Comments: Left inner lower quadrant Lymphadenopathy:     Upper Body:     Right upper body: No supraclavicular, axillary or pectoral adenopathy.     Left upper body: No supraclavicular, axillary or pectoral adenopathy.  Skin:    General: Skin is warm and dry.  Neurological:     General: No focal deficit present.     Mental Status: She is alert. Mental status is at baseline.  Psychiatric:        Mood and Affect: Mood normal.        Behavior: Behavior normal.      Results for orders placed or performed in visit on 01/22/24  POCT HgB A1C  Result Value Ref Range   Hemoglobin A1C 6.2 (A) 4.0 - 5.6 %   HbA1c POC (<> result, manual entry)     HbA1c, POC (prediabetic range)     HbA1c, POC (controlled diabetic range)      Last CBC Lab Results  Component Value Date   WBC 7.3 08/01/2023   HGB 14.4 08/01/2023   HCT 42.9 08/01/2023   MCV 87.2 08/01/2023   MCH 29.3 08/01/2023   RDW 13.0 08/01/2023   PLT 283 08/01/2023   Last metabolic panel Lab Results  Component Value Date   GLUCOSE 110 (H) 08/01/2023   NA 138 08/01/2023   K 3.9 08/01/2023   CL 103 08/01/2023   CO2 28 08/01/2023   BUN 10 08/01/2023   CREATININE  0.66 08/01/2023   GFRNONAA >60 08/29/2022   CALCIUM 9.6 08/01/2023   PROT 7.0 08/01/2023   ALBUMIN 3.7 08/29/2022   BILITOT 0.5 08/01/2023   ALKPHOS 42 08/29/2022   AST 22 08/01/2023   ALT 35 (H) 08/01/2023   ANIONGAP 11 08/29/2022   Last lipids Lab Results  Component Value Date   CHOL 191 08/01/2023   HDL 44 (L) 08/01/2023   LDLCALC  08/01/2023     Comment:     . LDL cholesterol not calculated. Triglyceride levels greater than 400 mg/dL invalidate calculated LDL results. . Reference range: <100 . Desirable range <100 mg/dL for  primary prevention;   <70 mg/dL for patients with CHD or diabetic patients  with > or = 2 CHD risk factors. SABRA LDL-C is now calculated using the Martin-Hopkins  calculation, which is a validated novel method providing  better accuracy than the Friedewald equation in the  estimation of LDL-C.  Gladis APPLETHWAITE et al. SANDREA. 7986;689(80): 2061-2068  (http://education.[facility].com/faq/FAQ164)    TRIG 423 (H) 08/01/2023   CHOLHDL 4.3 08/01/2023   Last hemoglobin A1c Lab Results  Component Value Date   HGBA1C 6.2 (A) 01/22/2024   Last thyroid  functions Lab Results  Component Value Date   TSH 1.86 08/02/2022   Last vitamin D  Lab Results  Component Value Date   VD25OH 33 08/01/2023   Last vitamin B12 and Folate Lab Results  Component Value Date   VITAMINB12 412 08/01/2023   FOLATE >23.3 08/17/2015      The ASCVD Risk score (Arnett DK, et al., 2019) failed to calculate for the following reasons:   The 2019 ASCVD risk score is only valid for ages 37 to 42    Assessment & Plan:   Assessment & Plan Polyarthralgia and fatigue, possible autoimmune etiology Chronic joint pain and fatigue with positive ANA and negative rheumatoid-specific labs. Differential includes fibromyalgia and autoimmune conditions. Family history of psoriasis suggests autoimmune etiology. - Order comprehensive autoimmune panel. - Advise to avoid wheat.  Prediabetes A1c increased to 6.2%, indicating progression. Inconsistent metformin  use noted. - Prescribe metformin  500 mg once daily. - Advise to monitor and reduce sugars and carbohydrates. - Recheck A1c at next visit.  Migraine Intermittent migraines managed with Imitrex , prefers Excedrin for mild episodes. - Continue Imitrex  prescription.  Food allergies (wheat, gluten, cow's milk, hazelnuts, peanuts, apple, crab) Allergy testing revealed sensitivities. Difficulty avoiding wheat and gluten. - Advise to continue avoiding identified allergens,  emphasize reducing wheat intake.  Breast lump, evaluation in progress Palpable breast lump, differential includes fibroadenoma. - Perform breast exam. - Consider breast ultrasound based on findings.  General Health Maintenance Discussion on breast self-exams and menstrual cycle changes. - Educate on breast self-exam techniques and awareness of changes.  - Analyzer ANA IFA w/RFLX Titer/Pattern,Systemic Autoimmune Panel 1 - US  LIMITED ULTRASOUND INCLUDING AXILLA LEFT BREAST ; Future - US  LIMITED ULTRASOUND INCLUDING AXILLA RIGHT BREAST; Future - POCT HgB A1C - metFORMIN  (GLUCOPHAGE ) 500 MG tablet; Take 1 tablet (500 mg total) by mouth daily with breakfast.  Dispense: 90 tablet; Refill: 1 - Flu vaccine trivalent PF, 6mos and older(Flulaval,Afluria,Fluarix,Fluzone)   Return in about 6 months (around 07/22/2024).    [name], DO

## 2024-01-28 ENCOUNTER — Ambulatory Visit
Admission: RE | Admit: 2024-01-28 | Discharge: 2024-01-28 | Disposition: A | Discharge status: Home / Self Care | Source: Ambulatory Visit | Attending: Internal Medicine | Admitting: Internal Medicine

## 2024-01-28 DIAGNOSIS — N6324 Unspecified lump in the left breast, lower inner quadrant: Secondary | ICD-10-CM | POA: Diagnosis present

## 2024-01-29 LAB — ANALYZER(R)ANA IFA WITH REFLEX TITER/PATTRN,SYS AUTOIMM PNL1
Anti Nuclear Antibody (ANA): POSITIVE — AB
Anticardiolipin IgA: 4.4 [APL'U]/mL
Anticardiolipin IgG: <2 [GPL'U]/mL
Anticardiolipin IgM: 5.4 [MPL'U]/mL
Beta-2 Glyco 1 IgA: 2 U/mL
Beta-2 Glyco 1 IgM: 5.2 U/mL
Beta-2 Glyco I IgG: <2 U/mL
C3 Complement: 153 mg/dL (ref 83–193)
C4 Complement: 15 mg/dL (ref 15–57)
Centromere Ab Screen: <1 AI
Chromatin (Nucleosomal) Antibody: <1 AI
Cyclic Citrullin Peptide Ab: <16 U
DNA Ab (DS) Crithidia, IFA: NEGATIVE
ENA SM Ab Ser-aCnc: <1 AI
Jo-1 Autoabs: <1 AI
MUTATED CITRULLINATED VIMENTIN (MCV) AB: <20 U/mL (ref <20)
Rheumatoid Factor (IgA): <5 U
Rheumatoid Factor (IgG): <5 U
Rheumatoid Factor (IgM): 10 U — ABNORMAL HIGH
Ribonucleic Protein(ENA) Antibody, IgG: <1 AI
SM/RNP: <1 AI
SSA (Ro) (ENA) Antibody, IgG: <1 AI
SSB (La) (ENA) Antibody, IgG: <1 AI
Scleroderma (Scl-70) (ENA) Antibody, IgG: <1 AI
Thyroperoxidase Ab SerPl-aCnc: 1 [IU]/mL (ref <9)

## 2024-01-29 LAB — ANTI-NUCLEAR AB-TITER (ANA TITER): ANA Titer 1: 1:80 {titer} — ABNORMAL HIGH

## 2024-02-02 ENCOUNTER — Ambulatory Visit: Payer: Self-pay | Admitting: Internal Medicine

## 2024-02-02 ENCOUNTER — Telehealth: Payer: Self-pay

## 2024-02-02 DIAGNOSIS — R7689 Other specified abnormal immunological findings in serum: Secondary | ICD-10-CM

## 2024-02-02 DIAGNOSIS — M2550 Pain in unspecified joint: Secondary | ICD-10-CM

## 2024-02-02 NOTE — Telephone Encounter (Signed)
 Labs not reviewed yet. Please advice

## 2024-02-02 NOTE — Telephone Encounter (Signed)
 Copied from CRM [id number]. Topic: Clinical - Lab/Test Results >> Jan 30, 2024  4:56 PM Delon DASEN wrote: Reason for CRM: calling about lab results- nothing noted by Dr yet

## 2024-03-05 LAB — GENECONNECT MOLECULAR SCREEN: Genetic Analysis Overall Interpretation: NEGATIVE

## 2024-03-25 ENCOUNTER — Telehealth: Payer: Self-pay | Admitting: Internal Medicine

## 2024-03-25 NOTE — Telephone Encounter (Signed)
 Dropped off paperwork for Dr Bernardo to fill out. Its for [facility] nursing program. Has been placed in Dr Bernardo clear folder

## 2024-03-26 NOTE — Telephone Encounter (Signed)
 Will need appt

## 2024-04-05 NOTE — Telephone Encounter (Signed)
 Pt has appt sch'd for 04/06/24

## 2024-04-06 ENCOUNTER — Ambulatory Visit (INDEPENDENT_AMBULATORY_CARE_PROVIDER_SITE_OTHER): Admitting: Internal Medicine

## 2024-04-06 ENCOUNTER — Encounter: Payer: Self-pay | Admitting: Internal Medicine

## 2024-04-06 ENCOUNTER — Other Ambulatory Visit: Payer: Self-pay

## 2024-04-06 VITALS — BP 118/82 | HR 72 | Resp 16 | Ht 66.0 in | Wt 256.0 lb

## 2024-04-06 DIAGNOSIS — Z23 Encounter for immunization: Secondary | ICD-10-CM | POA: Diagnosis not present

## 2024-04-06 DIAGNOSIS — Z9229 Personal history of other drug therapy: Secondary | ICD-10-CM

## 2024-04-06 DIAGNOSIS — Z0289 Encounter for other administrative examinations: Secondary | ICD-10-CM

## 2024-04-06 DIAGNOSIS — Z111 Encounter for screening for respiratory tuberculosis: Secondary | ICD-10-CM | POA: Diagnosis not present

## 2024-04-06 NOTE — Progress Notes (Signed)
" ° °  Acute Office Visit  Subjective:     Patient ID: April Buck, female    DOB: 1985/09/16, 38 y.o.   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   Discussed the use of AI scribe software for clinical note transcription with the patient, who gave verbal consent to proceed.  History of Present Illness April Buck is a 38 year old female who presents for a review of her immunization records and management of her medical conditions.  She wants to confirm that her vaccines are up to date. She has received TDAP, polio, MMR, hepatitis B, and a flu vaccine in October. Her varicella immunity is unclear, with negative varicella titers as of December 01, 2020, and her meningococcal vaccine status is uncertain.  She has rheumatoid arthritis managed by rheumatology with current medication, and she recently had x-rays and labs for disease assessment.  She is treated for migraines, anxiety, and irritable bowel syndrome and has hypertriglyceridemia.   Review of Systems  All other systems reviewed and are negative.       Objective:    BP 118/82 (Cuff Size: Large)   Pulse 72   Resp 16   Ht 5' 6 (1.676 m)   Wt 256 lb (116.1 kg)   SpO2 97%   BMI 41.32 kg/m    Physical Exam Constitutional:      Appearance: Normal appearance.  HENT:     Head: Normocephalic and atraumatic.  Eyes:     Conjunctiva/sclera: Conjunctivae normal.  Cardiovascular:     Rate and Rhythm: Normal rate and regular rhythm.  Pulmonary:     Effort: Pulmonary effort is normal.     Breath sounds: Normal breath sounds.  Skin:    General: Skin is warm and dry.  Neurological:     General: No focal deficit present.     Mental Status: She is alert. Mental status is at baseline.  Psychiatric:        Mood and Affect: Mood normal.        Behavior: Behavior normal.     No results found for any visits  on 04/06/24.      Assessment & Plan:   Assessment & Plan Encounter for Forms for Nursing School  Routine wellness visit focused on immunization and lab results. - Administered meningococcal vaccine. - Ordered QuantiFERON Gold test. - Ordered varicella titers. - Documented hepatitis B immunity. - Documented influenza vaccination status.   - Visual acuity screening - Hearing screening; Future - QuantiFERON-TB Gold Plus - Varicella zoster antibody, IgG - Meningococcal MCV4O(Menveo)   Return for already scheduled.  [name], DO   "

## 2024-04-08 LAB — QUANTIFERON-TB GOLD PLUS
Mitogen-NIL: 6.26 [IU]/mL
NIL: 0.05 [IU]/mL
QuantiFERON-TB Gold Plus: NEGATIVE
TB1-NIL: 0 [IU]/mL
TB2-NIL: 0.01 [IU]/mL

## 2024-04-08 LAB — VARICELLA ZOSTER ANTIBODY, IGG: Varicella IgG: 8.37 {s_co_ratio}

## 2024-04-09 ENCOUNTER — Ambulatory Visit: Payer: Self-pay | Admitting: Internal Medicine

## 2024-04-14 NOTE — Telephone Encounter (Unsigned)
 Copied from CRM [id number]. Topic: General - Other >> Apr 14, 2024  4:04 PM Lauren C wrote: Reason for CRM: Pt calling to let us  know that she came by to pick up paperwork for school and was given back the original, uncompleted copy with nothing filled in. She says she will be coming tomorrow to drop off that original copy to have it completed, but it is due by this week.

## 2024-04-16 ENCOUNTER — Telehealth

## 2024-04-16 DIAGNOSIS — J111 Influenza due to unidentified influenza virus with other respiratory manifestations: Secondary | ICD-10-CM | POA: Diagnosis not present

## 2024-04-17 MED ORDER — PROMETHAZINE-DM 6.25-15 MG/5ML PO SYRP: 5.0000 mL | ORAL_SOLUTION | Freq: Four times a day (QID) | ORAL | 0 refills | Status: AC | PRN

## 2024-04-17 MED ORDER — OSELTAMIVIR PHOSPHATE 75 MG PO CAPS: 75.0000 mg | ORAL_CAPSULE | Freq: Two times a day (BID) | ORAL | 0 refills | Status: AC

## 2024-04-17 NOTE — Progress Notes (Signed)
 E visit for Flu like symptoms   We are sorry that you are not feeling well.  Here is how we plan to help! Based on what you have shared with me it looks like you may have a respiratory virus that may be influenza.  Influenza or the flu is  an infection caused by a respiratory virus. The flu virus is highly contagious and persons who did not receive their yearly flu vaccination may catch the flu from close contact.  We have anti-viral medications to treat the viruses that cause this infection. They are not a cure and only shorten the course of the infection. These prescriptions are most effective when they are given within the first 2 days of flu symptoms. Antiviral medications are indicated if you have a high risk of complications from the flu. You should  also consider an antiviral medication if you are in close contact with someone who is at risk. These medications can help patients avoid complications from the flu but have side effects that you should know.   Possible side effects from Tamiflu  or oseltamivir  include nausea, vomiting, diarrhea, dizziness, headaches, eye redness, sleep problems or other respiratory symptoms. You should not take Tamiflu  if you have an allergy to oseltamivir  or any to the ingredients in Tamiflu .  Based upon your symptoms and potential risk factors I have prescribed Oseltamivir  (Tamiflu ).  It has been sent to your designated pharmacy.  You will take one 75 mg capsule orally twice a day for the next 5 days.   For nasal congestion, you may use an oral decongestant such as Mucinex  D or if you have glaucoma or high blood pressure use plain Mucinex .  Saline nasal spray or nasal drops can help and can safely be used as often as needed for congestion.  If you have a sore or scratchy throat, use a saltwater gargle-  to  teaspoon of salt dissolved in a 4-ounce to 8-ounce glass of warm water.  Gargle the solution for approximately 15-30 seconds and then spit.  It is  important not to swallow the solution.  You can also use throat lozenges/cough drops and Chloraseptic spray to help with throat pain or discomfort.  Warm or cold liquids can also be helpful in relieving throat pain.  For headache, pain or general discomfort, you can use Ibuprofen  or Tylenol  as directed.   Some authorities believe that zinc  sprays or the use of Echinacea may shorten the course of your symptoms.  I have prescribed the following medications to help lessen symptoms: I have prescribed Phenergan  DM 6.25 mg/15 mg. You make take one teaspoon / 5 ml every 4-6 hours as needed for cough  You are to isolate at home until you have been fever-free for at least 24 hours without a fever-reducing medication, and symptoms have been steadily improving for 24 hours.  If you must be around other household members who do not have symptoms, you need to make sure that both you and the family members are masking consistently with a high-quality mask.  If you note any worsening of symptoms despite treatment, please seek an in-person evaluation ASAP. If you note any significant shortness of breath or any chest pain, please seek ED evaluation. Please do not delay care!  ANYONE WHO HAS FLU SYMPTOMS SHOULD: Stay home. The flu is highly contagious and going out or to work exposes others! Be sure to drink plenty of fluids. Water is fine as well as fruit juices, sodas and electrolyte beverages. You  may want to stay away from caffeine or alcohol. If you are nauseated, try taking small sips of liquids. How do you know if you are getting enough fluid? Your urine should be a pale yellow or almost colorless. Get rest. Taking a steamy shower or using a humidifier may help nasal congestion and ease sore throat pain. Using a saline nasal spray works much the same way. Cough drops, hard candies and sore throat lozenges may ease your cough. Line up a caregiver. Have someone check on you regularly.  GET HELP RIGHT AWAY  IF: You cannot keep down liquids or your medications. You become short of breath Your fell like you are going to pass out or loose consciousness. Your symptoms persist after you have completed your treatment plan  MAKE SURE YOU  Understand these instructions. Will watch your condition. Will get help right away if you are not doing well or get worse.  Your e-visit answers were reviewed by a board certified advanced clinical practitioner to complete your personal care plan.  Depending on the condition, your plan could have included both over the counter or prescription medications.  If there is a problem please reply  once you have received a response from your provider.  Your safety is important to us .  If you have drug allergies check your prescription carefully.    You can use MyChart to ask questions about todays visit, request a non-urgent call back, or ask for a work or school excuse for 24 hours related to this e-Visit. If it has been greater than 24 hours you will need to follow up with your provider, or enter a new e-Visit to address those concerns.  You will get an e-mail in the next two days asking about your experience.  I hope that your e-visit has been valuable and will speed your recovery. Thank you for using e-visits.   I have spent 5 minutes in review of e-visit questionnaire, review and updating patient chart, medical decision making and response to patient.   Sakeenah Valcarcel, FNP

## 2024-04-19 ENCOUNTER — Encounter: Payer: Self-pay | Admitting: Internal Medicine

## 2024-04-20 ENCOUNTER — Other Ambulatory Visit: Payer: Self-pay | Admitting: Internal Medicine

## 2024-04-29 ENCOUNTER — Other Ambulatory Visit: Payer: Self-pay | Admitting: Internal Medicine

## 2024-04-29 DIAGNOSIS — E781 Pure hyperglyceridemia: Secondary | ICD-10-CM

## 2024-04-29 DIAGNOSIS — F331 Major depressive disorder, recurrent, moderate: Secondary | ICD-10-CM

## 2024-04-29 NOTE — Telephone Encounter (Signed)
 Requested Prescriptions  Pending Prescriptions Disp Refills   fenofibrate  (TRICOR ) 145 MG tablet [Pharmacy Med Name: FENOFIBRATE  145 MG TABLET] 90 tablet 0    Sig: TAKE 1 TABLET BY MOUTH EVERY DAY     Cardiovascular:  Antilipid - Fibric Acid Derivatives Failed - 04/29/2024 11:41 AM      Failed - ALT in normal range and within 360 days    ALT  Date Value Ref Range Status  08/01/2023 35 (H) 6 - 29 U/L Final   SGPT (ALT)  Date Value Ref Range Status  06/07/2013 39 12 - 78 U/L Final         Failed - eGFR is 30 or above and within 360 days    EGFR (African American)  Date Value Ref Range Status  03/05/2014 >60 >43mL/min Final  05/18/2013 >60  Final   GFR calc Af Amer  Date Value Ref Range Status  07/03/2015 >60 >60 mL/min Final    Comment:    (NOTE) The eGFR has been calculated using the CKD EPI equation. This calculation has not been validated in all clinical situations. eGFR's persistently <60 mL/min signify possible Chronic Kidney Disease.    EGFR (Non-African Amer.)  Date Value Ref Range Status  03/05/2014 >60 >56mL/min Final    Comment:    eGFR values <30mL/min/1.73 m2 may be an indication of chronic kidney disease (CKD). Calculated eGFR, using the MRDR Study equation, is useful in  patients with stable renal function. The eGFR calculation will not be reliable in acutely ill patients when serum creatinine is changing rapidly. It is not useful in patients on dialysis. The eGFR calculation may not be applicable to patients at the low and high extremes of body sizes, pregnant women, and vegetarians.   05/18/2013 >60  Final    Comment:    eGFR values <69mL/min/1.73 m2 may be an indication of chronic kidney disease (CKD). Calculated eGFR is useful in patients with stable renal function. The eGFR calculation will not be reliable in acutely ill patients when serum creatinine is changing rapidly. It is not useful in  patients on dialysis. The eGFR calculation may not be  applicable to patients at the low and high extremes of body sizes, pregnant women, and vegetarians.    GFR, Estimated  Date Value Ref Range Status  08/29/2022 >60 >60 mL/min Final    Comment:    (NOTE) Calculated using the CKD-EPI Creatinine Equation (2021)    GFR  Date Value Ref Range Status  06/08/2015 112.44 >60.00 mL/min Final   eGFR  Date Value Ref Range Status  08/02/2022 120 > OR = 60 mL/min/1.72m2 Final         Failed - Lipid Panel in normal range within the last 12 months    Cholesterol  Date Value Ref Range Status  08/01/2023 191 <200 mg/dL Final   LDL Cholesterol (Calc)  Date Value Ref Range Status  08/01/2023  mg/dL (calc) Final    Comment:    . LDL cholesterol not calculated. Triglyceride levels greater than 400 mg/dL invalidate calculated LDL results. . Reference range: <100 . Desirable range <100 mg/dL for primary prevention;   <70 mg/dL for patients with CHD or diabetic patients  with > or = 2 CHD risk factors. [facility] LDL-C is now calculated using the Martin-Hopkins  calculation, which is a validated novel method providing  better accuracy than the Friedewald equation in the  estimation of LDL-C.  Gladis APPLETHWAITE et al. SANDREA. 7986;689(80): 2061-2068  (http://education.[facility].com/faq/FAQ164)  HDL  Date Value Ref Range Status  08/01/2023 44 (L) > OR = 50 mg/dL Final   Triglycerides  Date Value Ref Range Status  08/01/2023 423 (H) <150 mg/dL Final    Comment:    . If a non-fasting specimen was collected, consider repeat triglyceride testing on a fasting specimen if clinically indicated.  Veatrice et al. J. of Clin. Lipidol. 2015;9:129-169. [facility]          Passed - AST in normal range and within 360 days    AST  Date Value Ref Range Status  08/01/2023 22 10 - 30 U/L Final   SGOT(AST)  Date Value Ref Range Status  06/07/2013 16 15 - 37 Unit/L Final         Passed - Cr in normal range and within 360 days    Creat  Date Value Ref  Range Status  08/01/2023 0.66 0.50 - 0.97 mg/dL Final         Passed - HGB in normal range and within 360 days    Hemoglobin  Date Value Ref Range Status  08/01/2023 14.4 11.7 - 15.5 g/dL Final   HGB  Date Value Ref Range Status  03/05/2014 16.0 12.0 - 16.0 g/dL Final         Passed - HCT in normal range and within 360 days    HCT  Date Value Ref Range Status  08/01/2023 42.9 35.0 - 45.0 % Final  03/05/2014 47.3 (H) 35.0 - 47.0 % Final         Passed - PLT in normal range and within 360 days    Platelets  Date Value Ref Range Status  08/01/2023 283 140 - 400 Thousand/uL Final   Platelet  Date Value Ref Range Status  03/05/2014 280 150 - 440 x10 3/mm 3 Final         Passed - WBC in normal range and within 360 days    WBC  Date Value Ref Range Status  08/01/2023 7.3 3.8 - 10.8 Thousand/uL Final         Passed - Valid encounter within last 12 months    Recent Outpatient Visits           3 weeks ago Encounter for completion of form with patient   [facility] Bernardo Fend, [facility]   3 months ago Polyarthralgia   [facility] Bernardo Fend, DO   6 months ago Insomnia, unspecified type   [facility] Bernardo Fend, DO   9 months ago Polyarthralgia   [facility] Bernardo Fend, DO               sertraline  (ZOLOFT ) 100 MG tablet [Pharmacy Med Name: SERTRALINE  HCL 100 MG TABLET] 90 tablet 0    Sig: TAKE 1 TAB BY MOUTH DAILY. TO TAKE IN ADDITION TO 50 MG TABLET FOR TOTAL OF 150 MG DAILY.     Psychiatry:  Antidepressants - SSRI - sertraline  Failed - 04/29/2024 11:41 AM      Failed - ALT in normal range and within 360 days    ALT  Date Value Ref Range Status  08/01/2023 35 (H) 6 - 29 U/L Final   SGPT (ALT)  Date Value Ref Range Status  06/07/2013 39 12 - 78 U/L Final         Passed - AST in normal range and within 360 days    AST  Date  Value Ref Range  Status  08/01/2023 22 10 - 30 U/L Final   SGOT(AST)  Date Value Ref Range Status  06/07/2013 16 15 - 37 Unit/L Final         Passed - Completed PHQ-2 or PHQ-9 in the last 360 days      Passed - Valid encounter within last 6 months    Recent Outpatient Visits           3 weeks ago Encounter for completion of form with patient   [facility] Bernardo Fend, DO   3 months ago Polyarthralgia   [facility] Bernardo Fend, DO   6 months ago Insomnia, unspecified type   [facility] Bernardo Fend, DO   9 months ago Polyarthralgia   [facility] Bernardo Fend, DO               sertraline  (ZOLOFT ) 50 MG tablet [Pharmacy Med Name: SERTRALINE  HCL 50 MG TABLET] 90 tablet 0    Sig: TAKE 1 TAB BY MOUTH DAILY. TAKE IN ADDITION TO 100 MG TABLET FOR A TOTAL OF 150 MG A DAY.     Psychiatry:  Antidepressants - SSRI - sertraline  Failed - 04/29/2024 11:41 AM      Failed - ALT in normal range and within 360 days    ALT  Date Value Ref Range Status  08/01/2023 35 (H) 6 - 29 U/L Final   SGPT (ALT)  Date Value Ref Range Status  06/07/2013 39 12 - 78 U/L Final         Passed - AST in normal range and within 360 days    AST  Date Value Ref Range Status  08/01/2023 22 10 - 30 U/L Final   SGOT(AST)  Date Value Ref Range Status  06/07/2013 16 15 - 37 Unit/L Final         Passed - Completed PHQ-2 or PHQ-9 in the last 360 days      Passed - Valid encounter within last 6 months    Recent Outpatient Visits           3 weeks ago Encounter for completion of form with patient   [facility] Bernardo Fend, DO   3 months ago Polyarthralgia   [facility] Bernardo Fend, DO   6 months ago Insomnia, unspecified type   [facility] Bernardo Fend, DO   9 months ago  Polyarthralgia   [facility] Bernardo Fend, [place]

## 2024-07-22 ENCOUNTER — Ambulatory Visit: Admitting: Internal Medicine
